# Patient Record
Sex: Female | Born: 1935 | Race: White | Hispanic: No | Marital: Married | Smoking: Never smoker
Health system: Southern US, Community
[De-identification: ages and names within clinical notes are randomized; demographics above are authoritative.]

## PROBLEM LIST (undated history)

## (undated) DIAGNOSIS — I10 Essential (primary) hypertension: Secondary | ICD-10-CM

## (undated) DIAGNOSIS — M1990 Unspecified osteoarthritis, unspecified site: Secondary | ICD-10-CM

---

## 2012-04-28 ENCOUNTER — Encounter (HOSPITAL_COMMUNITY): Payer: Self-pay | Admitting: Nurse Practitioner

## 2012-04-28 ENCOUNTER — Inpatient Hospital Stay (HOSPITAL_COMMUNITY)
Admission: AD | Admit: 2012-04-28 | Discharge: 2012-05-01 | DRG: 287 | Disposition: A | Discharge status: Home / Self Care | Payer: Medicare Other | Source: Other Acute Inpatient Hospital | Attending: Cardiovascular Disease | Admitting: Cardiovascular Disease

## 2012-04-28 DIAGNOSIS — Z79899 Other long term (current) drug therapy: Secondary | ICD-10-CM

## 2012-04-28 DIAGNOSIS — I447 Left bundle-branch block, unspecified: Secondary | ICD-10-CM

## 2012-04-28 DIAGNOSIS — I359 Nonrheumatic aortic valve disorder, unspecified: Secondary | ICD-10-CM

## 2012-04-28 DIAGNOSIS — M25519 Pain in unspecified shoulder: Secondary | ICD-10-CM | POA: Diagnosis present

## 2012-04-28 DIAGNOSIS — I2510 Atherosclerotic heart disease of native coronary artery without angina pectoris: Secondary | ICD-10-CM | POA: Diagnosis present

## 2012-04-28 DIAGNOSIS — I350 Nonrheumatic aortic (valve) stenosis: Secondary | ICD-10-CM

## 2012-04-28 DIAGNOSIS — Z7982 Long term (current) use of aspirin: Secondary | ICD-10-CM

## 2012-04-28 DIAGNOSIS — Z23 Encounter for immunization: Secondary | ICD-10-CM

## 2012-04-28 DIAGNOSIS — R748 Abnormal levels of other serum enzymes: Secondary | ICD-10-CM | POA: Diagnosis present

## 2012-04-28 DIAGNOSIS — R7989 Other specified abnormal findings of blood chemistry: Secondary | ICD-10-CM

## 2012-04-28 DIAGNOSIS — Y840 Cardiac catheterization as the cause of abnormal reaction of the patient, or of later complication, without mention of misadventure at the time of the procedure: Secondary | ICD-10-CM | POA: Diagnosis not present

## 2012-04-28 DIAGNOSIS — Y9210 Unspecified residential institution as the place of occurrence of the external cause: Secondary | ICD-10-CM | POA: Diagnosis not present

## 2012-04-28 DIAGNOSIS — M1990 Unspecified osteoarthritis, unspecified site: Secondary | ICD-10-CM | POA: Diagnosis present

## 2012-04-28 DIAGNOSIS — IMO0002 Reserved for concepts with insufficient information to code with codable children: Secondary | ICD-10-CM | POA: Diagnosis not present

## 2012-04-28 DIAGNOSIS — M25512 Pain in left shoulder: Secondary | ICD-10-CM

## 2012-04-28 DIAGNOSIS — I10 Essential (primary) hypertension: Secondary | ICD-10-CM | POA: Diagnosis present

## 2012-04-28 HISTORY — DX: Unspecified osteoarthritis, unspecified site: M19.90

## 2012-04-28 HISTORY — DX: Essential (primary) hypertension: I10

## 2012-04-28 LAB — HEPARIN LEVEL (UNFRACTIONATED): Heparin Unfractionated: 0.45 IU/mL (ref 0.30–0.70)

## 2012-04-28 LAB — MRSA PCR SCREENING: MRSA by PCR: NEGATIVE

## 2012-04-28 LAB — TROPONIN I: Troponin I: <0.3 ng/mL (ref <0.30)

## 2012-04-28 MED ORDER — METOPROLOL TARTRATE 25 MG PO TABS
25.0000 mg | ORAL_TABLET | Freq: Two times a day (BID) | ORAL | Status: DC
  Administered 2012-04-28 – 2012-05-01 (×6): 25 mg via ORAL
  Filled 2012-04-28 (×7): qty 1

## 2012-04-28 MED ORDER — SODIUM CHLORIDE 0.9 % IJ SOLN
3.0000 mL | Freq: Two times a day (BID) | INTRAMUSCULAR | Status: DC
  Administered 2012-04-28 – 2012-05-01 (×6): 3 mL via INTRAVENOUS

## 2012-04-28 MED ORDER — HEPARIN (PORCINE) IN NACL 100-0.45 UNIT/ML-% IJ SOLN
880.0000 [IU]/h | INTRAMUSCULAR | Status: DC
  Filled 2012-04-28: qty 250

## 2012-04-28 MED ORDER — SODIUM CHLORIDE 0.9 % IV SOLN
1.0000 mL/kg/h | INTRAVENOUS | Status: DC
  Administered 2012-04-29: 1 mL/kg/h via INTRAVENOUS

## 2012-04-28 MED ORDER — ATORVASTATIN CALCIUM 80 MG PO TABS
80.0000 mg | ORAL_TABLET | Freq: Every day | ORAL | Status: DC
  Administered 2012-04-28 – 2012-05-01 (×4): 80 mg via ORAL
  Filled 2012-04-28 (×4): qty 1

## 2012-04-28 MED ORDER — SODIUM CHLORIDE 0.9 % IV SOLN
250.0000 mL | INTRAVENOUS | Status: DC | PRN
  Administered 2012-04-28: 250 mL via INTRAVENOUS

## 2012-04-28 MED ORDER — ACETAMINOPHEN 325 MG PO TABS
650.0000 mg | ORAL_TABLET | ORAL | Status: DC | PRN
  Administered 2012-04-28 – 2012-05-01 (×4): 650 mg via ORAL
  Filled 2012-04-28 (×4): qty 2

## 2012-04-28 MED ORDER — ASPIRIN EC 81 MG PO TBEC
81.0000 mg | DELAYED_RELEASE_TABLET | Freq: Every day | ORAL | Status: DC
  Administered 2012-04-29 – 2012-05-01 (×2): 81 mg via ORAL
  Filled 2012-04-28 (×3): qty 1

## 2012-04-28 MED ORDER — SODIUM CHLORIDE 0.9 % IJ SOLN: 3.0000 mL | INTRAMUSCULAR | Status: DC | PRN

## 2012-04-28 MED ORDER — NITROGLYCERIN 0.4 MG SL SUBL: 0.4000 mg | SUBLINGUAL_TABLET | SUBLINGUAL | Status: DC | PRN

## 2012-04-28 MED ORDER — ONDANSETRON HCL 4 MG/2ML IJ SOLN: 4.0000 mg | Freq: Four times a day (QID) | INTRAMUSCULAR | Status: DC | PRN

## 2012-04-28 NOTE — Progress Notes (Signed)
  Echocardiogram 2D Echocardiogram has been performed.  Georgian Co 04/28/2012, 5:55 PM

## 2012-04-28 NOTE — Progress Notes (Signed)
ANTICOAGULATION CONSULT NOTE - Initial Consult  Pharmacy Consult for heparin Indication: chest pain/ACS  No Known Allergies  Patient Measurements: Height: 5\' 1"  (154.9 cm) Weight: 122 lb 2.2 oz (55.4 kg) IBW/kg (Calculated) : 47.8  Vital Signs: Temp: 97.5 F (36.4 C) (03/16 1500) Temp src: Oral (03/16 1500) BP: 140/43 mmHg (03/16 1500)  Labs: No results found for this basename: HGB, HCT, PLT, APTT, LABPROT, INR, HEPARINUNFRC, CREATININE, CKTOTAL, CKMB, TROPONINI,  in the last 72 hours  CrCl is unknown because no creatinine reading has been taken.   Medical History: Past Medical History  Diagnosis Date  . Hypertension     a. on lisinopril x 4 yrs.  . Osteoarthritis   . Heart murmur     Medications:  No prescriptions prior to admission    Assessment: 79 yo lady transferred from [facility] with c/o Left arm pain.  Heparin drip initiated there with 2800 unit bolus and drip at 880 units/hr.   Goal of Therapy:  Heparin level 0.3-0.7 units/ml Monitor platelets by anticoagulation protocol: Yes   Plan:  Continue heparin drip at 880 units/hr Check HL 8 hours after start. Daily HL and CBC.  Talbert Cage Poteet 04/28/2012,4:48 PM

## 2012-04-28 NOTE — H&P (Addendum)
Patient ID: Rita Walters MRN: [id number], DOB/AGE: [birth year]   Admit date: 04/28/2012   Primary Physician: A. Janene Madeira Primary Cardiologist: new - will f/u in [facility]  Pt. Profile:  79 year old female without prior cardiac history who presents on transfer from [facility] following complaints of left arm pain with mild troponin elevation.  Problem List  Past Medical History  Diagnosis Date  . Hypertension     a. on lisinopril x 4 yrs.  . Osteoarthritis   . Heart murmur     No past surgical history on file.   Allergies  Allergies not on file  HPI  79 year old female without prior cardiac history. She has never been hospitalized in her lifetime. She is quite functional at home. Approximately 2 weeks ago, she fell and struck her left upper body. She did not have any pain or obvious trauma and didn't think too much of this. On Friday, March 14, she had just arisen from the couch when she had sudden onset of left upper arm and shoulder discomfort without associated symptoms. This was somewhat worsened by abduction and abduction of the left arm. Discomfort occurred intermittently throughout the day on Friday without any particular aggravating factors or alleviating factors. Symptoms recurred Saturday morning prompting her to present to [facility] for evaluation. There, she was found to have mild elevation of troponin up to 0.09, and her ECG showed a left bundle branch block of unknown duration. She was also found to have a systolic murmur on exam. A bedside echocardiogram showed normal LV function and aortic stenosis. She has not had any further left arm or shoulder discomfort. She was transferred to [facility] for further evaluation and diagnostic catheterization. She is currently pain-free.  Home Medications  Lisinopril 20 mg daily Mobic 7.5 mg twice a day Tramadol 50 mg every 4-6 hours when necessary  Family History  Family History  Problem Relation Age of Onset    . Other      non-contributory for premature cad   Social History  History   Social History  . Marital Status: Married    Spouse Name: N/A    Number of Children: N/A  . Years of Education: N/A   Occupational History  . Not on file.   Social History Main Topics  . Smoking status: Never Smoker   . Smokeless tobacco: Not on file  . Alcohol Use: No  . Drug Use: No  . Sexually Active: Not on file   Other Topics Concern  . Not on file   Social History Narrative   Pt lives in [place] with her husband and her son.  She is retired.  She is active around the house but does not routinely exercise per se.    Review of Systems General:  No chills, fever, night sweats or weight changes.  Cardiovascular: Left upper arm and shoulder pain as outlined above. No chest pain, dyspnea on exertion, edema, orthopnea, palpitations, paroxysmal nocturnal dyspnea. Dermatological: No rash, lesions/masses Respiratory: No cough, dyspnea Urologic: No hematuria, dysuria Abdominal:   No nausea, vomiting, diarrhea, bright red blood per rectum, melena, or hematemesis Neurologic:  No visual changes, wkns, changes in mental status. All other systems reviewed and are otherwise negative except as noted above.  Physical Exam  There were no vitals taken for this visit.  General: Pleasant, NAD Psych: Normal affect. Neuro: Alert and oriented X 3. Moves all extremities spontaneously. HEENT: Normal  Neck: Supple without  JVD.  There is radiation of  the AS murmur into the  carotid Lungs:  Resp regular and unlabored, CTA. Heart: RRR no s3, s4. 2/6 systolic ejection murmur heard loudest at the right upper sternal border. Radiation into the right carotid Abdomen: Soft, non-tender, non-distended, BS + x 4.  Extremities: No clubbing, cyanosis or edema. DP/PT/Radials 2+ and equal bilaterally.  Labs  INR 0.9  CK 196 -> 144 -> 175  CK-MB 6.9 -> 5.5, 5.5  Troponin I 0.04 -> 0.09 -> 0.06  Sodium 137,  potassium 5.0, chloride 100, CO2 28, BUN 27, creatinine 0.94, calcium 9.1, total protein 7.6, albumin 4.2, alkaline phosphatase 68, AST 22, ALT 20, total bilirubin 0.4  Hemoglobin 11.5, hematocrit 35.5, WBC 8.6, platelets 256  Radiology/Studies  Chest x-ray on March 15 showed no evidence for acute abnormality.  X-ray of the left shoulder showed mild irregularity of the superior cortical surface of the distal clavicle raising question of fracture in that region.  ECG  Rsr, 86, lbbb of unknown duration.  ASSESSMENT AND PLAN  1. Left upper arm and shoulder pain with mild troponin elevation: In setting of left bundle branch block of unknown duration symptoms are concerning for non-ST elevation MI. We will check a troponin here as well as a 2-D echocardiogram as the one performed at [facility] was a quick look at bedside echo only. Continue aspirin, statin, beta blocker, and heparin. We will plan cardiac catheterization but would like to see echo results first.  2. Aortic stenosis: Question severity. Obtain 2-D echo.  3. Hypertension: Follow. Next  4. Status: Currently unknown. Continue statin therapy. LFTs were normal at [facility].  Signed, Nicolasa Ducking, NP 04/28/2012, 3:45 PM .  Attending Note:   The patient was seen and examined.  Agree with assessment and plan as noted above.  Changes made to the above note as needed.  Rita Walters is a relatively healthy female with hx of AS.  She developed left shoulder pain several days ago ( fell 2 weeks ago)  Evaluation at [facility] revealed a LBBB ( unknown duration).  She also had a very minimally elevated troponon.  She was transferred to [facility] for further eval.  Will repeat the echo.  The last AV mean gradient was 36.  We will schedule her for a right and left cath.  The bedside echo at [facility] shows normal LV function.   Vesta Mixer, Montez Hageman., MD, [facility] 04/28/2012, 4:08 PM

## 2012-04-28 NOTE — Progress Notes (Signed)
ANTICOAGULATION CONSULT NOTE - Follow Up Consult  Pharmacy Consult for heparin Indication: chest pain/ACS  No Known Allergies  Patient Measurements: Height: 5\' 1"  (154.9 cm) Weight: 122 lb 2.2 oz (55.4 kg) IBW/kg (Calculated) : 47.8  Vital Signs: Temp: 98.8 F (37.1 C) (03/16 2120) Temp src: Oral (03/16 2120) BP: 109/82 mmHg (03/16 1800) Pulse Rate: 66 (03/16 2120)  Labs:  Recent Labs  04/28/12 1744 04/28/12 2056  HEPARINUNFRC  --  0.45  TROPONINI <0.30  --     CrCl is unknown because no creatinine reading has been taken.   Medical History: Past Medical History  Diagnosis Date  . Hypertension     a. on lisinopril x 4 yrs.  . Osteoarthritis   . Heart murmur     Medications:  Prescriptions prior to admission  Medication Sig Dispense Refill  . aspirin EC 81 MG tablet Take 81 mg by mouth every other day.      . lisinopril (PRINIVIL,ZESTRIL) 20 MG tablet Take 20 mg by mouth daily.      . Multiple Vitamin (MULTIVITAMIN WITH MINERALS) TABS Take 1 tablet by mouth every morning.       . traMADol (ULTRAM) 50 MG tablet Take 50 mg by mouth every 8 (eight) hours as needed for pain. For pain        Assessment: 79 yo lady transferred from [facility] with c/o Left arm pain.  Heparin drip initiated there with 2800 unit bolus and drip at 880 units/hr.  Initial heparin level therapeutic. Goal of Therapy:  Heparin level 0.3-0.7 units/ml Monitor platelets by anticoagulation protocol: Yes   Plan:  Continue heparin drip at 880 units/hr Recheck HL in 8 hours to confirm.  Talbert Cage Poteet 04/28/2012,9:54 PM

## 2012-04-29 ENCOUNTER — Encounter (HOSPITAL_COMMUNITY): Payer: Self-pay | Admitting: Cardiology

## 2012-04-29 ENCOUNTER — Encounter (HOSPITAL_COMMUNITY)
Admission: AD | Disposition: A | Discharge status: Home / Self Care | Payer: Self-pay | Source: Other Acute Inpatient Hospital | Attending: Cardiovascular Disease

## 2012-04-29 DIAGNOSIS — I2510 Atherosclerotic heart disease of native coronary artery without angina pectoris: Secondary | ICD-10-CM

## 2012-04-29 DIAGNOSIS — I359 Nonrheumatic aortic valve disorder, unspecified: Secondary | ICD-10-CM

## 2012-04-29 HISTORY — PX: LEFT AND RIGHT HEART CATHETERIZATION WITH CORONARY ANGIOGRAM: SHX5449

## 2012-04-29 LAB — POCT I-STAT 3, VENOUS BLOOD GAS (G3P V)
Acid-Base Excess: 2 mmol/L (ref 0.0–2.0)
Bicarbonate: 27.1 mEq/L — ABNORMAL HIGH (ref 20.0–24.0)
Bicarbonate: 27.2 mEq/L — ABNORMAL HIGH (ref 20.0–24.0)
Bicarbonate: 27.6 mEq/L — ABNORMAL HIGH (ref 20.0–24.0)
O2 Saturation: 65 %
TCO2: 29 mmol/L (ref 0–100)
pCO2, Ven: 47 mmHg (ref 45.0–50.0)
pH, Ven: 7.37 — ABNORMAL HIGH (ref 7.250–7.300)
pH, Ven: 7.379 — ABNORMAL HIGH (ref 7.250–7.300)
pO2, Ven: 35 mmHg (ref 30.0–45.0)
pO2, Ven: 36 mmHg (ref 30.0–45.0)

## 2012-04-29 LAB — BASIC METABOLIC PANEL
BUN: 23 mg/dL (ref 6–23)
Creatinine, Ser: 0.99 mg/dL (ref 0.50–1.10)
GFR calc Af Amer: 60 mL/min — ABNORMAL LOW (ref >90)
GFR calc non Af Amer: 52 mL/min — ABNORMAL LOW (ref >90)
Potassium: 4.6 mEq/L (ref 3.5–5.1)

## 2012-04-29 LAB — CBC
Hemoglobin: 10.5 g/dL — ABNORMAL LOW (ref 12.0–15.0)
MCHC: 33.7 g/dL (ref 30.0–36.0)
RDW: 13.6 % (ref 11.5–15.5)
WBC: 5.9 10*3/uL (ref 4.0–10.5)

## 2012-04-29 LAB — LIPID PANEL: Cholesterol: 144 mg/dL (ref 0–200)

## 2012-04-29 LAB — POCT I-STAT 3, ART BLOOD GAS (G3+)
pCO2 arterial: 40.6 mmHg (ref 35.0–45.0)
pH, Arterial: 7.415 (ref 7.350–7.450)
pO2, Arterial: 81 mmHg (ref 80.0–100.0)

## 2012-04-29 SURGERY — LEFT AND RIGHT HEART CATHETERIZATION WITH CORONARY ANGIOGRAM
Anesthesia: LOCAL

## 2012-04-29 MED ORDER — LIDOCAINE HCL (PF) 1 % IJ SOLN
INTRAMUSCULAR | Status: AC
  Filled 2012-04-29: qty 30

## 2012-04-29 MED ORDER — ADULT MULTIVITAMIN W/MINERALS CH
1.0000 | ORAL_TABLET | Freq: Every morning | ORAL | Status: DC
  Administered 2012-04-30 – 2012-05-01 (×2): 1 via ORAL
  Filled 2012-04-29 (×3): qty 1

## 2012-04-29 MED ORDER — HEPARIN (PORCINE) IN NACL 2-0.9 UNIT/ML-% IJ SOLN
INTRAMUSCULAR | Status: AC
  Filled 2012-04-29: qty 1000

## 2012-04-29 MED ORDER — ASPIRIN EC 81 MG PO TBEC
81.0000 mg | DELAYED_RELEASE_TABLET | ORAL | Status: DC
  Filled 2012-04-29: qty 1

## 2012-04-29 MED ORDER — LISINOPRIL 20 MG PO TABS
20.0000 mg | ORAL_TABLET | Freq: Every day | ORAL | Status: DC
  Administered 2012-04-29 – 2012-04-30 (×2): 20 mg via ORAL
  Filled 2012-04-29 (×2): qty 1

## 2012-04-29 MED ORDER — SODIUM CHLORIDE 0.9 % IV SOLN: INTRAVENOUS | Status: AC

## 2012-04-29 MED FILL — Heparin Sodium (Porcine) 100 Unt/ML in Sodium Chloride 0.45%: INTRAMUSCULAR | Qty: 250 | Status: AC

## 2012-04-29 NOTE — Care Management Note (Signed)
    Page 1 of 1   04/29/2012     1:42:16 PM   CARE MANAGEMENT NOTE 04/29/2012  Patient:  Rita Walters,Rita Walters   Account Number:  [id number]  Date Initiated:  04/29/2012  Documentation initiated by:  Junius Creamer  Subjective/Objective Assessment:   adm w pos troponin     Action/Plan:   lives w husband, pcp dr a shah   Anticipated DC Date:     Anticipated DC Plan:        DC Planning Services  CM consult      Choice offered to / List presented to:             Status of service:   Medicare Important Message given?   (If response is "NO", the following Medicare IM given date fields will be blank) Date Medicare IM given:   Date Additional Medicare IM given:    Discharge Disposition:    Per UR Regulation:  Reviewed for med. necessity/level of care/duration of stay  If discussed at Long Length of Stay Meetings, dates discussed:    Comments:  3/17 1341 debbie Uma Jerde rn,bsn

## 2012-04-29 NOTE — CV Procedure (Signed)
Findings  Severe AS MIld pul HTN 75% LAD stenosis.   Full report to follow

## 2012-04-29 NOTE — Progress Notes (Signed)
ANTICOAGULATION CONSULT NOTE - Follow Up Consult  Pharmacy Consult for heparin Indication: chest pain/ACS  No Known Allergies  Patient Measurements: Height: 5\' 1"  (154.9 cm) Weight: 121 lb (54.885 kg) IBW/kg (Calculated) : 47.8  Vital Signs: Temp: 98.1 F (36.7 C) (03/17 0730) Temp src: Oral (03/17 0730) BP: 159/55 mmHg (03/17 0755) Pulse Rate: 59 (03/17 0400)  Labs:  Recent Labs  04/28/12 1744 04/28/12 2056 04/29/12 0520 04/29/12 0812  HGB  --   --  10.5*  --   HCT  --   --  31.2*  --   PLT  --   --  222  --   HEPARINUNFRC  --  0.45  --  0.64  CREATININE  --   --  0.99  --   TROPONINI <0.30  --   --   --     Estimated Creatinine Clearance: 33.1 ml/min (by C-G formula based on Cr of 0.99).   Medical History: Past Medical History  Diagnosis Date  . Hypertension     a. on lisinopril x 4 yrs.  . Osteoarthritis   . Heart murmur     Assessment: 79 yo female transferred from [facility] with c/o Left arm pain.  On heparin at goal (HL=0.64).  Goal of Therapy:  Heparin level 0.3-0.7 units/ml Monitor platelets by anticoagulation protocol: Yes   Plan:  -Continue heparin drip at 880 units/hr -Will follow plans for cath -Daily HL and CBC  Harland German, Pharm D 04/29/2012 9:20 AM

## 2012-04-29 NOTE — CV Procedure (Signed)
   Cardiac Catheterization Procedure Note  Name: Rita Walters MRN: [id number] DOB: 02/06/1928  Procedure: Right Heart Cath, Placement of catheters for angio without left heart cath, Selective Coronary Angiography, aortic root aortography.   Indication:   Left shoulder pain after fall pos troponins   Procedural Details: The right groin was prepped, draped, and anesthetized with 1% lidocaine. Using the modified Seldinger technique a 5 French sheath was placed in the right femoral artery and a 7 French sheath was placed in the right femoral vein. A Swan-Ganz catheter was used for the right heart catheterization. Standard protocol was followed for recording of right heart pressures and sampling of oxygen saturations. Fick cardiac output was calculated. Standard Judkins catheters were used for selective coronary angiography and left ventriculography. There were no immediate procedural complications. The patient was transferred to the post catheterization recovery area for further monitoring.  Procedural Findings: Hemodynamics RA 4 RV 37/4 PA 36/11 PCWP 12 LV not done AO 150/57 (93)  Oxygen saturations: SVC 67% PA 64% AO 96%  Cardiac Output (Fick) 4.91  Cardiac Index (Fick) 3.21 Cardiac Output (Thermo)  3.25 Cardiac Index  (Thermo)  2.12   Coronary angiography: Coronary dominance: right  Left mainstem: Mild calcification and normal   Left anterior descending (LAD): Mild irregularity and calcification proximally with about 40% narrowing.  40% narrowing at takeoff prior to second diagonal.  75% smooth narrowing in LAD after second diagonal.    Left circumflex (LCx): Provides a small ramus that is free of significant disease.  The OM is large and has 30% smooth narrowing.  Smaller AV circ without critical disease  Right coronary artery (RCA): Large caliber RCA providing a large PDA and moderate PLA.   Aortic root:  Heavily calcified aortic valve with fairly limited mobility.  Also  moderate AI noted.  Aortic root size is normal.    Left ventriculography: Not done   Final Conclusions:   1.  Severe AS by echo   0.6 cm2 2.  Moderately severe mid LAD stenosis not ideal for PCI.   Recommendations:  1.  I will review with Delane Ginger MD and decide on a plan.  The valve is fairly severe and she likely needs replacement soon.  ? Symptoms unclear.     Shawnie Pons 04/29/2012, 2:05 PM

## 2012-04-29 NOTE — Progress Notes (Signed)
Pt got back from cath lab with groin level 0; requested to use bedpan; log rolled onto bedpan; pt toileted; pt began to bleed at cath sight; pressure held; bug clot ??? Found in bedpan, unsure of origin; MD made aware and came to bedside; no new orders received; pt VSS; bed rest started over at 1530; will continue to monitor closely and update as needed

## 2012-04-29 NOTE — Interval H&P Note (Signed)
History and Physical Interval Note:  04/29/2012 12:45 PM  Rita Walters  has presented today for surgery, with the diagnosis of AS  The various methods of treatment have been discussed with the patient and family. After consideration of risks, benefits and other options for treatment, the patient has consented to  Procedure(s): LEFT AND RIGHT HEART CATHETERIZATION WITH CORONARY ANGIOGRAM (N/A) as a surgical intervention .  The patient's history has been reviewed, patient examined, no change in status, stable for surgery.  I have reviewed the patient's chart and labs.  Questions were answered to the patient's satisfaction.     Shawnie Pons

## 2012-04-30 ENCOUNTER — Inpatient Hospital Stay (HOSPITAL_COMMUNITY): Payer: Medicare Other

## 2012-04-30 DIAGNOSIS — I2510 Atherosclerotic heart disease of native coronary artery without angina pectoris: Secondary | ICD-10-CM | POA: Diagnosis present

## 2012-04-30 DIAGNOSIS — M25519 Pain in unspecified shoulder: Secondary | ICD-10-CM | POA: Diagnosis present

## 2012-04-30 DIAGNOSIS — I350 Nonrheumatic aortic (valve) stenosis: Secondary | ICD-10-CM | POA: Diagnosis present

## 2012-04-30 LAB — CBC
MCH: 30.9 pg (ref 26.0–34.0)
MCHC: 34.2 g/dL (ref 30.0–36.0)
Platelets: 231 10*3/uL (ref 150–400)
RBC: 3.11 MIL/uL — ABNORMAL LOW (ref 3.87–5.11)

## 2012-04-30 MED ORDER — LISINOPRIL 10 MG PO TABS
10.0000 mg | ORAL_TABLET | Freq: Every day | ORAL | Status: DC
  Administered 2012-05-01: 10 mg via ORAL
  Filled 2012-04-30: qty 1

## 2012-04-30 MED ORDER — TRAMADOL HCL 50 MG PO TABS
50.0000 mg | ORAL_TABLET | Freq: Three times a day (TID) | ORAL | Status: DC | PRN
  Administered 2012-04-30 – 2012-05-01 (×3): 50 mg via ORAL
  Filled 2012-04-30 (×3): qty 1

## 2012-04-30 MED ORDER — TRAMADOL HCL 50 MG PO TABS
25.0000 mg | ORAL_TABLET | Freq: Three times a day (TID) | ORAL | Status: DC | PRN
  Filled 2012-04-30: qty 1

## 2012-04-30 NOTE — Progress Notes (Signed)
Patient Name: Rita Walters Date of Encounter: 04/30/2012     Active Problems:   * No active hospital problems. *    SUBJECTIVE  Patient seen last night and again this am.  Groin reviewed yesterday.  Films reviewed today with both Dr. Elease Hashimoto and Excell Seltzer in detail.  She has AS which is moderately severe.  Has minor groin discomfort today after problems on bed pain yesterday.    CURRENT MEDS . aspirin EC  81 mg Oral Daily  . aspirin EC  81 mg Oral QODAY  . atorvastatin  80 mg Oral q1800  . lisinopril  20 mg Oral Daily  . metoprolol tartrate  25 mg Oral BID  . multivitamin with minerals  1 tablet Oral q morning - 10a  . sodium chloride  3 mL Intravenous Q12H    OBJECTIVE  Filed Vitals:   04/30/12 0011 04/30/12 0356 04/30/12 0400 04/30/12 0745  BP:  107/37  127/37  Pulse:    68  Temp: 98.2 F (36.8 C) 98.6 F (37 C)  97.7 F (36.5 C)  TempSrc: Oral Oral  Oral  Resp:  21  18  Height:      Weight:   116 lb 13.5 oz (53 kg)   SpO2:  97%  96%    Intake/Output Summary (Last 24 hours) at 04/30/12 0824 Last data filed at 04/29/12 2000  Gross per 24 hour  Intake 849.45 ml  Output   1200 ml  Net -350.55 ml   Filed Weights   04/29/12 0400 04/29/12 0500 04/30/12 0400  Weight: 121 lb (54.885 kg) 121 lb (54.885 kg) 116 lb 13.5 oz (53 kg)    PHYSICAL EXAM  General: Pleasant, NAD. Neuro: Alert and oriented X 3. Moves all extremities spontaneously. Psych: Normal affect. HEENT:  Normal  Neck: Supple without bruits or JVD. Shoulder:  Movement laterally and medially reproduces the pain.  Elevation does not affect pain.   Lungs:  Resp regular and unlabored, CTA. Heart: RRR.  Late SEM.   Abdomen: Soft, non-tender, non-distended, BS + x 4.  Extremities: Has bruising in R groin.  No bruit.  Stable cath site.    Accessory Clinical Findings  CBC  Recent Labs  04/29/12 0520 04/30/12 0450  WBC 5.9 6.5  HGB 10.5* 9.6*  HCT 31.2* 28.1*  MCV 91.8 90.4  PLT 222 231    Basic Metabolic Panel  Recent Labs  04/29/12 0520  NA 138  K 4.6  CL 105  CO2 27  GLUCOSE 97  BUN 23  CREATININE 0.99  CALCIUM 8.8   Liver Function Tests No results found for this basename: AST, ALT, ALKPHOS, BILITOT, PROT, ALBUMIN,  in the last 72 hours No results found for this basename: LIPASE, AMYLASE,  in the last 72 hours Cardiac Enzymes  Recent Labs  04/28/12 1744  TROPONINI <0.30   BNP No components found with this basename: POCBNP,  D-Dimer No results found for this basename: DDIMER,  in the last 72 hours Hemoglobin A1C No results found for this basename: HGBA1C,  in the last 72 hours Fasting Lipid Panel  Recent Labs  04/29/12 0520  CHOL 144  HDL 62  LDLCALC 69  TRIG 65  CHOLHDL 2.3   Thyroid Function Tests No results found for this basename: TSH, T4TOTAL, FREET3, T3FREE, THYROIDAB,  in the last 72 hours  TELE  No strips available.    ECG  LBBB  Radiology/Studies  No results found.  ASSESSMENT AND PLAN  1.  Shoulder pain, likely MS in etiology   ---  Plan xrays  ? MRI 2.  Moderately severe AS  -- consensus opinion is that AS will require treatment, but not urgent.  Suggest FU with Dr. Excell Seltzer.  Finally, consensus also is the LAD is unlikely source for symptoms. 3.  Groin bleed post cath ----  Appears stable.  Will watch and transfer out of unit later today.    Signed, Shawnie Pons MD, [facility], FSCAI

## 2012-04-30 NOTE — Progress Notes (Signed)
Received from 2900. Assessed per flow sheet. Denies chest pain or shortness of breath. C/o shoulder pain d/t recent injury prior to admission. Refuses pain med at this time. Right groin bruised  no hematoma palpable. Right groin marked per previous RN; will monitor. Call bell and family near. Mamie Levers

## 2012-05-01 LAB — CBC
HCT: 27.6 % — ABNORMAL LOW (ref 36.0–46.0)
MCH: 31 pg (ref 26.0–34.0)
MCHC: 34.4 g/dL (ref 30.0–36.0)
MCV: 90.2 fL (ref 78.0–100.0)
Platelets: 207 10*3/uL (ref 150–400)
RDW: 13.4 % (ref 11.5–15.5)
WBC: 7.1 10*3/uL (ref 4.0–10.5)

## 2012-05-01 MED ORDER — LISINOPRIL 20 MG PO TABS: 10.0000 mg | ORAL_TABLET | Freq: Every day | ORAL | Status: DC

## 2012-05-01 MED ORDER — METOPROLOL TARTRATE 25 MG PO TABS: 25.0000 mg | ORAL_TABLET | Freq: Two times a day (BID) | ORAL | Status: DC

## 2012-05-01 MED ORDER — ASPIRIN EC 81 MG PO TBEC: 81.0000 mg | DELAYED_RELEASE_TABLET | Freq: Every day | ORAL | Status: AC

## 2012-05-01 MED ORDER — ATORVASTATIN CALCIUM 80 MG PO TABS: 80.0000 mg | ORAL_TABLET | Freq: Every day | ORAL | Status: DC

## 2012-05-01 NOTE — H&P (Deleted)
Discharge Summary   Patient ID: Rita Walters MRN: [id number], DOB/AGE: 1927-10-31 79 y.o. Admit date: 04/28/2012 D/C date:     05/01/2012  Primary Cardiologist: Seen by Dr. Riley Kill this admission but lives in [place]. Will eventually need f/u with Dr. Excell Seltzer.  Primary Discharge Diagnoses:  1. Shoulder pain likely musculoskeletal in etiology 2. Moderately severe AS - will eventually require AVR  3. CAD - mildly elevated troponin at [facility] 0.09, but CAD not felt to be causing symptoms (see #1) - moderately-severe LAD stenosis not ideal for PCI by cath this admission - statin started, consider OP f/u labs 4. Anemia, blood loss related from groin bleed 5. HTN  Secondary Discharge Diagnoses:  1. Osteoarthritis  Hospital Course: [name] is an 79 y/o F with history of HTN, OA who has never been hospitalized in her lifetime. She has been quite functional at home. She presented to [facility] with complaints of L upper arm pain. Approximately 2 weeks ago, she fell and struck her left upper body. She did not have any pain or obvious trauma and didn't think too much of this. On Friday, March 14, she had just arisen from the couch when she had sudden onset of left upper arm and shoulder discomfort without associated symptoms. This was somewhat worsened by abduction and abduction of the left arm. The discomfort occurred intermittently throughout the day on Friday without any particular aggravating factors or alleviating factors. The symptoms recurred the next day sos he went to the ER for evaluation. There, she was found to have mild elevation of troponin up to 0.09 (0.04 -> 0.09 -> 0.06, CK 196 -> 144 -> 175, CK-MB 6.9 -> 5.5, 5.5) , and her ECG showed a left bundle branch block of unknown duration. She was also found to have a systolic murmur on exam. A bedside echocardiogram showed normal LV function and aortic stenosis. She was transferred to [facility] for further evaluation. She  underwent L&RH on 3/17 demonstrating AS and moderately severe mid LAD stenosis not ideal for PCI. Films were reviewed with colleagues and her AS was felt to be moderately-severe. The consensus was that AS will require treatment, but not urgent, and that the LAD is unlikely source for symptoms. She was started on BB/statin.The patient had a groin bleed post cath that improved and remained stable. Today she is feeling well. She does have some shoulder pain with movement but this is felt to be MSK in etiology. L shoulder film was negative. Dr. Riley Kill has seen and examined her and feels she is stable for discharge. She was seen by PT prior to discharge and no acute needs were identified. We will plan for close initial OP f/u in our [facility] office, then she should be referred to Dr. Excell Seltzer to follow her valve.  Discharge Vitals: Blood pressure 121/43, pulse 79, temperature 98.1 F (36.7 C), temperature source Oral, resp. rate 17, height 5\' 1"  (1.549 m), weight 122 lb 2.2 oz (55.4 kg), SpO2 96.00%.  Labs: Lab Results  Component Value Date   WBC 7.1 05/01/2012   HGB 9.5* 05/01/2012   HCT 27.6* 05/01/2012   MCV 90.2 05/01/2012   PLT 207 05/01/2012    Recent Labs Lab 04/29/12 0520  NA 138  K 4.6  CL 105  CO2 27  BUN 23  CREATININE 0.99  CALCIUM 8.8  GLUCOSE 97    Recent Labs  04/28/12 1744  TROPONINI <0.30   Lab Results  Component Value Date  CHOL 144 04/29/2012   HDL 62 04/29/2012   LDLCALC 69 04/29/2012   TRIG 65 04/29/2012   No results found for this basename: DDIMER    Diagnostic Studies/Procedures   1. Cardiac catheterization this admission, please see full report and above for summary.  2. 2D Echo 04/28/12 Study Conclusions - Left ventricle: The cavity size was mildly dilated. Wall thickness was normal. The estimated ejection fraction was 55%. - Aortic valve: There was moderate stenosis. Mild regurgitation. Valve area: 0.65cm^2(VTI). Valve area: 0.57cm^2 (Vmax). - Mitral valve:  Mild regurgitation. - Left atrium: The atrium was mildly dilated. - Atrial septum: No defect or patent foramen ovale was identified. - Pulmonary arteries: PA peak pressure: 40mm Hg (S).  3. Dg Shoulder Left  04/30/2012  *RADIOLOGY REPORT*  Clinical Data: Fall.  Left shoulder pain.  LEFT SHOULDER - 2+ VIEW  Comparison: None.  Findings: Internal and external rotation views appear normal. There is no fracture.  Scapula appears normal.  Visualized left chest unremarkable.  IMPRESSION: Negative.   Original Report Authenticated By: Andreas Newport, M.D.     Discharge Medications     Medication List    TAKE these medications       aspirin EC 81 MG tablet  Take 1 tablet (81 mg total) by mouth daily.     atorvastatin 80 MG tablet  Commonly known as:  LIPITOR  Take 1 tablet (80 mg total) by mouth daily.     lisinopril 20 MG tablet  Commonly known as:  PRINIVIL,ZESTRIL  Take 0.5 tablets (10 mg total) by mouth daily.     metoprolol tartrate 25 MG tablet  Commonly known as:  LOPRESSOR  Take 1 tablet (25 mg total) by mouth 2 (two) times daily.     multivitamin with minerals Tabs  Take 1 tablet by mouth every morning.     traMADol 50 MG tablet  Commonly known as:  ULTRAM  Take 50 mg by mouth every 8 (eight) hours as needed for pain. For pain        Disposition   The patient will be discharged in stable condition to home.     Discharge Orders   Future Appointments Provider Department Dept Phone   05/10/2012 1:55 PM Prescott Parma, PA-C [facility] (near [place]) [phone]   Future Orders Complete By Expires     Diet - low sodium heart healthy  As directed     Increase activity slowly  As directed     Comments:      No driving until cleared by your doctor. No lifting over 5 lbs for 1 week. No sexual activity for 1 week. Keep procedure site clean & dry. If you notice increased pain, swelling, bleeding or pus, call/return!  You may shower, but no soaking baths/hot  tubs/pools for 1 week.      Follow-up Information   Follow up with SERPE, EUGENE, PA-C. (05/10/12 at 1:55pm)    Contact information:   [facility] [place] [address] [place], [address] [place] [address] [phone]       Follow up with [facility], MD. (Follow up with primary doctor to further evaluate your shoulder pain)    Contact information:   [address]  [place] [address] [phone]         Duration of Discharge Encounter: Greater than 30 minutes including physician and PA time.  Signed, Dayna Dunn PA-C 05/01/2012, 1:51 PM

## 2012-05-01 NOTE — Evaluation (Signed)
Physical Therapy Evaluation Patient Details Name: Rita Walters MRN: [id number] DOB: [birth year] Today's Date: 05/01/2012 Time: 4098-1191 PT Time Calculation (min): 28 min  PT Assessment / Plan / Recommendation Clinical Impression  patient admitted with symptom of worsening intermittent L arm pain and chest pain.  On arrival, pt found to have Aortic Stenosis that will eventually need attension, but not urgent.   Although not back to baseline and mildly unsteady with gait, pt is safe enough to D/C home with family and will be capable of recuperating on her own.  No further PT, no follow up.    PT Assessment  Patent does not need any further PT services    Follow Up Recommendations  No PT follow up    Does the patient have the potential to tolerate intense rehabilitation      Barriers to Discharge        Equipment Recommendations  None recommended by PT    Recommendations for Other Services     Frequency      Precautions / Restrictions Precautions Precautions: Fall Restrictions Weight Bearing Restrictions: No   Pertinent Vitals/Pain       Mobility  Bed Mobility Bed Mobility: Supine to Sit;Sitting - Scoot to Edge of Bed Supine to Sit: 6: Modified independent (Device/Increase time) Sitting - Scoot to Edge of Bed: 6: Modified independent (Device/Increase time) Details for Bed Mobility Assistance: safe mobility Transfers Transfers: Sit to Stand;Stand to Sit Sit to Stand: 5: Supervision;With upper extremity assist;From bed Stand to Sit: 5: Supervision Details for Transfer Assistance: safe mobility Ambulation/Gait Ambulation/Gait Assistance: 4: Min guard Ambulation Distance (Feet): 280 Feet Assistive device: Rolling walker Ambulation/Gait Assistance Details: mildly unsteady with tendency to wander to the Left.  No overt LOB or deviations other than difficulty keeping RW straight. Gait Pattern: Step-through pattern Stairs: No    Exercises     PT Diagnosis:    PT  Problem List:   PT Treatment Interventions:     PT Goals    Visit Information  Last PT Received On: 05/01/12 Assistance Needed: +1    Subjective Data  Subjective: No, my shoulder didn't just start hurting Saturday when I came in.  It had been hurting on/off and it just got worse. They says it's not from a heart problem. Patient Stated Goal: Ready to go home   Prior Functioning  Home Living Lives With: Spouse;Son Available Help at Discharge: Family;Available 24 hours/day Type of Home: House Home Access: Stairs to enter [employer] of Steps: 2 Entrance Stairs-Rails: None Home Layout: One level Bathroom Shower/Tub: Tub/shower unit;Door [employer] Toilet: Standard Home Adaptive Equipment: Bedside commode/3-in-1;Straight cane;Walker - rolling;Walker - standard Prior Function Level of Independence: Independent with assistive device(s) Able to Take Stairs?: Yes Driving: No Communication Communication: No difficulties    Cognition  Cognition Overall Cognitive Status: Appears within functional limits for tasks assessed/performed Arousal/Alertness: Awake/alert Orientation Level: Appears intact for tasks assessed Behavior During Session: [facility] for tasks performed    Extremity/Trunk Assessment Right Lower Extremity Assessment RLE ROM/Strength/Tone: Within functional levels Left Lower Extremity Assessment LLE ROM/Strength/Tone: Within functional levels Trunk Assessment Trunk Assessment: Normal   Balance Balance Balance Assessed: No  End of Session PT - End of Session Activity Tolerance: Patient tolerated treatment well Patient left: Other (comment);with call bell/phone within reach;with restraints reapplied (sitting EOB) Nurse Communication: Mobility status  GP     Mickayla Trouten, Eliseo Gum 05/01/2012, 4:04 PM 05/01/2012  [place], PT [phone] (pager)

## 2012-05-01 NOTE — Progress Notes (Addendum)
Patient Name: Rita Walters Date of Encounter: 05/01/2012     Active Problems:   Shoulder pain   Aortic stenosis   CAD (coronary artery disease), native coronary artery    SUBJECTIVE  No unstable symptoms.  Shoulder pain is made worse with forward and backward motion, unassociated with diaphoresis or shortness of breath.    CURRENT MEDS . aspirin EC  81 mg Oral Daily  . atorvastatin  80 mg Oral q1800  . lisinopril  10 mg Oral Daily  . metoprolol tartrate  25 mg Oral BID  . multivitamin with minerals  1 tablet Oral q morning - 10a  . sodium chloride  3 mL Intravenous Q12H    OBJECTIVE  Filed Vitals:   04/30/12 1600 04/30/12 1723 04/30/12 2135 05/01/12 0641  BP: 99/36 107/37 105/45 133/46  Pulse: 53 70 71 73  Temp: 98.2 F (36.8 C) 98.6 F (37 C) 98.1 F (36.7 C) 98.1 F (36.7 C)  TempSrc: Oral Oral Oral Oral  Resp: 15 18 18 18   Height:      Weight:    122 lb 2.2 oz (55.4 kg)  SpO2: 98% 98% 98% 99%    Intake/Output Summary (Last 24 hours) at 05/01/12 0903 Last data filed at 04/30/12 2300  Gross per 24 hour  Intake    480 ml  Output    650 ml  Net   -170 ml   Filed Weights   04/29/12 0500 04/30/12 0400 05/01/12 0641  Weight: 121 lb (54.885 kg) 116 lb 13.5 oz (53 kg) 122 lb 2.2 oz (55.4 kg)    PHYSICAL EXAM  General: Pleasant, NAD. Neuro: Alert and oriented X 3. Moves all extremities spontaneously. Psych: Normal affect. HEENT:  Normal  Neck: Supple without bruits or JVD.  S Shoulder:  A and P movement makes it worse with activity Lungs:  Resp regular and unlabored, CTA. Heart: RRR.  3/6 mid to late peaking SEM.   Abdomen: Soft, non-tender, non-distended, BS + x 4.  Extremities: No clubbing, cyanosis or edema. DP/PT/Radials 2+ and equal bilaterally.  Accessory Clinical Findings  CBC  Recent Labs  04/30/12 0450 05/01/12 0500  WBC 6.5 7.1  HGB 9.6* 9.5*  HCT 28.1* 27.6*  MCV 90.4 90.2  PLT 231 207   Basic Metabolic Panel  Recent Labs  04/29/12 0520  NA 138  K 4.6  CL 105  CO2 27  GLUCOSE 97  BUN 23  CREATININE 0.99  CALCIUM 8.8   Liver Function Tests No results found for this basename: AST, ALT, ALKPHOS, BILITOT, PROT, ALBUMIN,  in the last 72 hours No results found for this basename: LIPASE, AMYLASE,  in the last 72 hours Cardiac Enzymes  Recent Labs  04/28/12 1744  TROPONINI <0.30   BNP No components found with this basename: POCBNP,  D-Dimer No results found for this basename: DDIMER,  in the last 72 hours Hemoglobin A1C No results found for this basename: HGBA1C,  in the last 72 hours Fasting Lipid Panel  Recent Labs  04/29/12 0520  CHOL 144  HDL 62  LDLCALC 69  TRIG 65  CHOLHDL 2.3   Thyroid Function Tests No results found for this basename: TSH, T4TOTAL, FREET3, T3FREE, THYROIDAB,  in the last 72 hours    ECG  NSR. LBBB  Radiology/Studies  Dg Shoulder Left  04/30/2012  *RADIOLOGY REPORT*  Clinical Data: Fall.  Left shoulder pain.  LEFT SHOULDER - 2+ VIEW  Comparison: None.  Findings: Internal and external rotation views  appear normal. There is no fracture.  Scapula appears normal.  Visualized left chest unremarkable.  IMPRESSION: Negative.   Original Report Authenticated By: Andreas Newport, M.D.     ASSESSMENT AND PLAN  1.  Shoulder pain likely MS in etiology 2.  Moderately severe AS, reviewed with colleagues.  3.  Coronary artery disease, moderate LAD 4.  Anemia, blood loss related 5.  Advanced age.      Plan   Ambulate this am. Early fu as OP with Dr. Diona Browner or Excell Seltzer She will likely require AVR within a year or two.    Discussed with son.    Signed, Shawnie Pons MD, [facility], FSCAI  More than thirty minutes of combined DC time.

## 2012-05-01 NOTE — Discharge Summary (Signed)
Discharge Summary   Patient ID: Rita Walters MRN: [id number], DOB/AGE: 77-Mar-1929 77 y.o. Admit date: 04/28/2012 D/C date:     05/01/2012  Primary Cardiologist: Seen by Dr. Riley Kill this admission but lives in [place]. Will eventually need f/u with Dr. Excell Seltzer.  Primary Discharge Diagnoses:  1. Shoulder pain likely musculoskeletal in etiology 2. Moderately severe AS - will eventually require AVR  3. CAD - mildly elevated troponin at [facility] 0.09, but CAD not felt to be causing symptoms (see #1) - moderately-severe LAD stenosis not ideal for PCI by cath this admission - statin started, consider OP f/u labs 4. Anemia, blood loss related from groin bleed 5. HTN  Secondary Discharge Diagnoses:  1. Osteoarthritis  Hospital Course: [name] is an 79 y/o F with history of HTN, OA who has never been hospitalized in her lifetime. She has been quite functional at home. She presented to [facility] with complaints of L upper arm pain. Approximately 2 weeks ago, she fell and struck her left upper body. She did not have any pain or obvious trauma and didn't think too much of this. On Friday, March 14, she had just arisen from the couch when she had sudden onset of left upper arm and shoulder discomfort without associated symptoms. This was somewhat worsened by abduction and abduction of the left arm. The discomfort occurred intermittently throughout the day on Friday without any particular aggravating factors or alleviating factors. The symptoms recurred the next day sos he went to the ER for evaluation. There, she was found to have mild elevation of troponin up to 0.09 (0.04 -> 0.09 -> 0.06, CK 196 -> 144 -> 175, CK-MB 6.9 -> 5.5, 5.5) , and her ECG showed a left bundle branch block of unknown duration. She was also found to have a systolic murmur on exam. A bedside echocardiogram showed normal LV function and aortic stenosis. She was transferred to [facility] for further evaluation. She  underwent L&RH on 3/17 demonstrating AS and moderately severe mid LAD stenosis not ideal for PCI. Films were reviewed with colleagues and her AS was felt to be moderately-severe. The consensus was that AS will require treatment, but not urgent, and that the LAD is unlikely source for symptoms. She was started on BB/statin.The patient had a groin bleed post cath that improved and remained stable. Today she is feeling well. She does have some shoulder pain with movement but this is felt to be MSK in etiology. L shoulder film was negative. Dr. Riley Kill has seen and examined her and feels she is stable for discharge. She was seen by PT prior to discharge and no acute needs were identified. We will plan for close initial OP f/u in our [facility] office, then she should be referred to Dr. Excell Seltzer to follow her valve.  Discharge Vitals: Blood pressure 121/43, pulse 79, temperature 98.1 F (36.7 C), temperature source Oral, resp. rate 17, height 5\' 1"  (1.549 m), weight 122 lb 2.2 oz (55.4 kg), SpO2 96.00%.  Labs: Lab Results  Component Value Date   WBC 7.1 05/01/2012   HGB 9.5* 05/01/2012   HCT 27.6* 05/01/2012   MCV 90.2 05/01/2012   PLT 207 05/01/2012    Recent Labs Lab 04/29/12 0520  NA 138  K 4.6  CL 105  CO2 27  BUN 23  CREATININE 0.99  CALCIUM 8.8  GLUCOSE 97    Recent Labs  04/28/12 1744  TROPONINI <0.30   Lab Results  Component Value Date  CHOL 144 04/29/2012   HDL 62 04/29/2012   LDLCALC 69 04/29/2012   TRIG 65 04/29/2012   No results found for this basename: DDIMER    Diagnostic Studies/Procedures   1. Cardiac catheterization this admission, please see full report and above for summary.  2. 2D Echo 04/28/12 Study Conclusions - Left ventricle: The cavity size was mildly dilated. Wall thickness was normal. The estimated ejection fraction was 55%. - Aortic valve: There was moderate stenosis. Mild regurgitation. Valve area: 0.65cm^2(VTI). Valve area: 0.57cm^2 (Vmax). - Mitral valve:  Mild regurgitation. - Left atrium: The atrium was mildly dilated. - Atrial septum: No defect or patent foramen ovale was identified. - Pulmonary arteries: PA peak pressure: 40mm Hg (S).  3. Dg Shoulder Left  04/30/2012  *RADIOLOGY REPORT*  Clinical Data: Fall.  Left shoulder pain.  LEFT SHOULDER - 2+ VIEW  Comparison: None.  Findings: Internal and external rotation views appear normal. There is no fracture.  Scapula appears normal.  Visualized left chest unremarkable.  IMPRESSION: Negative.   Original Report Authenticated By: Andreas Newport, M.D.     Discharge Medications     Medication List    TAKE these medications       aspirin EC 81 MG tablet  Take 1 tablet (81 mg total) by mouth daily.     atorvastatin 80 MG tablet  Commonly known as:  LIPITOR  Take 1 tablet (80 mg total) by mouth daily.     lisinopril 20 MG tablet  Commonly known as:  PRINIVIL,ZESTRIL  Take 0.5 tablets (10 mg total) by mouth daily.     metoprolol tartrate 25 MG tablet  Commonly known as:  LOPRESSOR  Take 1 tablet (25 mg total) by mouth 2 (two) times daily.     multivitamin with minerals Tabs  Take 1 tablet by mouth every morning.     traMADol 50 MG tablet  Commonly known as:  ULTRAM  Take 50 mg by mouth every 8 (eight) hours as needed for pain. For pain        Disposition   The patient will be discharged in stable condition to home.     Discharge Orders   Future Appointments Provider Department Dept Phone   05/10/2012 1:55 PM Prescott Parma, PA-C [place] [facility] ([facility] [place]) [phone]   Future Orders Complete By Expires     Diet - low sodium heart healthy  As directed     Increase activity slowly  As directed     Comments:      No driving until cleared by your doctor. No lifting over 5 lbs for 1 week. No sexual activity for 1 week. Keep procedure site clean & dry. If you notice increased pain, swelling, bleeding or pus, call/return!  You may shower, but no soaking baths/hot  tubs/pools for 1 week.      Follow-up Information   Follow up with SERPE, EUGENE, PA-C. (05/10/12 at 1:55pm)    Contact information:   [facility] [place] [address] [place], [address] [place] [address] [phone]       Follow up with [facility], MD. (Follow up with primary doctor to further evaluate your shoulder pain)    Contact information:   [address]  [place] [address] [phone]         Duration of Discharge Encounter: Greater than 30 minutes including physician and PA time.  Signed, Ronie Spies PA-C 05/01/2012, 1:51 PM  Patient was discharged with planned follow up.  I reviewed her  data extensively with Dr. Excell Seltzer.  He will be glad to see her in follow up.  It was felt that her symptoms were likely not tied to her AS, but that she would eventually need AVR.  However, she had been without symptoms prior to recent admission.  The patient and her family is fully informed.

## 2012-05-01 NOTE — Progress Notes (Signed)
Patient ambulated 150 ft in hall way using front wheel walker. Patient unsteady at time. States she has a couple of walkers at home. Returned to room w/out incidence. Call bell and husband near. Will monitor. Mamie Levers

## 2012-05-01 NOTE — Progress Notes (Signed)
D/c instructions along with med list  Provided. Prescriptions called to patient's pharmacy per Md. Iv d/c'd with catheter intact. Belongings with patient and family. Patient transported via wheelchair to main lobby for d/c home. Rita Walters

## 2012-05-06 ENCOUNTER — Telehealth: Payer: Self-pay | Admitting: *Deleted

## 2012-05-06 NOTE — Telephone Encounter (Signed)
SPOKE WITH PT  PER PT FEELS FINE SINCE DISCHARGE ALSO REVIEWED MED LIST WITH PT  AND PT HAS ALL MEDS AND F/U  IS SCHEDULED FOR  05-10-12  WITH GENE SERPE PAC .Zack Seal

## 2012-05-10 ENCOUNTER — Encounter: Payer: Self-pay | Admitting: Physician Assistant

## 2012-05-10 ENCOUNTER — Ambulatory Visit (INDEPENDENT_AMBULATORY_CARE_PROVIDER_SITE_OTHER): Payer: Medicare Other | Admitting: Physician Assistant

## 2012-05-10 VITALS — BP 133/67 | HR 101 | Ht 62.0 in | Wt 128.8 lb

## 2012-05-10 DIAGNOSIS — E785 Hyperlipidemia, unspecified: Secondary | ICD-10-CM

## 2012-05-10 DIAGNOSIS — I2510 Atherosclerotic heart disease of native coronary artery without angina pectoris: Secondary | ICD-10-CM

## 2012-05-10 DIAGNOSIS — I350 Nonrheumatic aortic (valve) stenosis: Secondary | ICD-10-CM

## 2012-05-10 DIAGNOSIS — I359 Nonrheumatic aortic valve disorder, unspecified: Secondary | ICD-10-CM

## 2012-05-10 DIAGNOSIS — R0989 Other specified symptoms and signs involving the circulatory and respiratory systems: Secondary | ICD-10-CM

## 2012-05-10 DIAGNOSIS — I10 Essential (primary) hypertension: Secondary | ICD-10-CM

## 2012-05-10 MED ORDER — ATORVASTATIN CALCIUM 40 MG PO TABS: 40.0000 mg | ORAL_TABLET | Freq: Every evening | ORAL | Status: DC

## 2012-05-10 MED ORDER — METOPROLOL TARTRATE 50 MG PO TABS: 50.0000 mg | ORAL_TABLET | Freq: Two times a day (BID) | ORAL | Status: DC

## 2012-05-10 NOTE — Assessment & Plan Note (Addendum)
Continued medical management of 1v CAD with moderately severe LAD disease, deemed not ideal for PCI.

## 2012-05-10 NOTE — Patient Instructions (Addendum)
   Decrease Lipitor to 40mg  every evening  Increase Lopressor to 50mg  twice a day   Continue all other current medications.  Carotid dopplers Office will contact with results Labs for fasting lipid and liver panel in 12 weeks (around August 10, 2012) - will send reminder in mail Follow up in  4 months

## 2012-05-10 NOTE — Progress Notes (Signed)
Primary Cardiologist: Simona Huh, MD (new)   HPI: Post hospital followup from [facility], status post initial presentation to [facility] ED with recurrent left shoulder discomfort and new LBBB, but no CP. She presented with no known CAD, but history of HTN and AS. She had had prior echocardiogram, 10/2011, per Dr. Margaretmary Eddy office, suggestive of severe/critical AS (AVA 0.6-0.7 cm), moderate MR, and moderate PHTN.    Patient was seen in the ED by the cardiology fellow, who was concerned that the patient's shoulder discomfort could possibly represent an anginal equivalent. Cardiac markers notable for peak troponin of 0.09, with initial abnormal MB of 6.9. She was started on IV heparin, Lopressor, and high-dose statin, with arrangements made for direct transfer to [facility].  Following transfer, she was referred for right/left heart catheterization, on March 17, with results as follows:   - Moderately severe 75% mid LAD stenosis, not ideal for PCI; LVG deferred   - Complete echocardiogram: EF 55%; moderate AS (AVA 0.65 cm by VTI); mild AR; mild MR  Upon further review of films, it was felt that the AS was moderately severe. Recommendation was for her to ultimately be referred to Dr. Tonny Bollman for further recommendations regarding treatment of her AS.  Clinically, she continues to report no CP, or prior history of such. Her left shoulder discomfort has resolved. She has only mild DOE, with no orthopnea, PND, or peripheral edema. She denies frank syncope or tachycardia palpitations.  She denies complications of R groin incision site.  No Known Allergies  Current Outpatient Prescriptions  Medication Sig Dispense Refill  . aspirin EC 81 MG tablet Take 1 tablet (81 mg total) by mouth daily.      Marland Kitchen atorvastatin (LIPITOR) 40 MG tablet Take 1 tablet (40 mg total) by mouth every evening.  30 tablet  6  . lisinopril (PRINIVIL,ZESTRIL) 20 MG tablet Take 0.5 tablets (10 mg total) by mouth daily.      . metoprolol  tartrate (LOPRESSOR) 50 MG tablet Take 1 tablet (50 mg total) by mouth 2 (two) times daily.  60 tablet  6  . Multiple Vitamin (MULTIVITAMIN WITH MINERALS) TABS Take 1 tablet by mouth every morning.       . traMADol (ULTRAM) 50 MG tablet Take 50 mg by mouth every 8 (eight) hours as needed for pain. For pain       No current facility-administered medications for this visit.    Past Medical History  Diagnosis Date  . Hypertension     a. on lisinopril x 4 yrs.  . Osteoarthritis     No past surgical history on file.  History   Social History  . Marital Status: Married    Spouse Name: N/A    Number of Children: N/A  . Years of Education: N/A   Occupational History  . Not on file.   Social History Main Topics  . Smoking status: Never Smoker   . Smokeless tobacco: Not on file  . Alcohol Use: No  . Drug Use: No  . Sexually Active: Not Currently   Other Topics Concern  . Not on file   Social History Narrative   Pt lives in [place] with her husband and her son.  She is retired.  She is active around the house but does not routinely exercise per se.    Family History  Problem Relation Age of Onset  . Other      non-contributory for premature cad    ROS: no nausea, vomiting; no fever,  chills; no melena, hematochezia; no claudication  PHYSICAL EXAM: BP 133/67  Pulse 101  Ht 5\' 2"  (1.575 m)  Wt 128 lb 12.8 oz (58.423 kg)  BMI 23.55 kg/m2 GENERAL:  79 year old female; NAD HEENT: NCAT, PERRLA, EOMI; sclera clear; no xanthelasma NECK: palpable bilateral carotid pulses, bilateral infraclavicular bruits; left supraclavicular bruit; no JVD; no TM LUNGS: CTA bilaterally CARDIAC: RRR (S1, S2);  harsh grade 3/6 crescendo decrescendo murmur at base; preserved S2; no rubs or gallops ABDOMEN: soft, non-tender; intact BS EXTREMETIES: intact distal pulses; no significant peripheral edema SKIN: warm/dry; no obvious rash/lesions MUSCULOSKELETAL: no joint deformity NEURO: no focal  deficit; NL affect   EKG:    ASSESSMENT & PLAN:  CAD (coronary artery disease), native coronary artery Continued medical management of 1v CAD with moderately severe LAD disease, deemed not ideal for PCI.  Aortic stenosis Continued close monitoring with early followup and surveillance echocardiograms, with plan to subsequently refer her to Dr. Tonny Bollman regarding treatment options for her AS.   HLD (hyperlipidemia) Will decrease high-dose statin, which was just initiated, to 40 mg daily. Recent lipid profile notable for LDL 65.  Hypertension Patient was recently advised to decrease lisinopril dose by 1/2, most likely in light of her significant AS. I advised her to continue with this recommendation. She was also just started on beta blocker therapy with Lopressor, which I will increase to 50 twice a day. This will help modulate both her resting sinus tachycardia, as well as her HTN.  Carotid bruit Will order carotid Dopplers to exclude true left ICA disease, versus radiated murmur of aortic stenosis.    Gene Kendarius Vigen, PAC

## 2012-05-10 NOTE — Assessment & Plan Note (Signed)
Will decrease high-dose statin, which was just initiated, to 40 mg daily. Recent lipid profile notable for LDL 65.

## 2012-05-10 NOTE — Assessment & Plan Note (Signed)
Will order carotid Dopplers to exclude true left ICA disease, versus radiated murmur of aortic stenosis.

## 2012-05-10 NOTE — Assessment & Plan Note (Signed)
Continued close monitoring with early followup and surveillance echocardiograms, with plan to subsequently refer her to Dr. Tonny Bollman regarding treatment options for her AS.

## 2012-05-10 NOTE — Assessment & Plan Note (Signed)
Patient was recently advised to decrease lisinopril dose by 1/2, most likely in light of her significant AS. I advised her to continue with this recommendation. She was also just started on beta blocker therapy with Lopressor, which I will increase to 50 twice a day. This will help modulate both her resting sinus tachycardia, as well as her HTN.

## 2012-05-23 ENCOUNTER — Encounter (INDEPENDENT_AMBULATORY_CARE_PROVIDER_SITE_OTHER): Payer: Medicare Other

## 2012-05-23 DIAGNOSIS — R0989 Other specified symptoms and signs involving the circulatory and respiratory systems: Secondary | ICD-10-CM

## 2012-05-23 DIAGNOSIS — I6529 Occlusion and stenosis of unspecified carotid artery: Secondary | ICD-10-CM

## 2012-05-28 ENCOUNTER — Encounter: Payer: Self-pay | Admitting: *Deleted

## 2012-08-12 ENCOUNTER — Ambulatory Visit (INDEPENDENT_AMBULATORY_CARE_PROVIDER_SITE_OTHER): Payer: Medicare Other | Admitting: Physician Assistant

## 2012-08-12 ENCOUNTER — Encounter: Payer: Self-pay | Admitting: Physician Assistant

## 2012-08-12 VITALS — BP 120/70 | HR 64 | Resp 16 | Ht 61.0 in | Wt 125.2 lb

## 2012-08-12 DIAGNOSIS — I359 Nonrheumatic aortic valve disorder, unspecified: Secondary | ICD-10-CM

## 2012-08-12 DIAGNOSIS — I350 Nonrheumatic aortic (valve) stenosis: Secondary | ICD-10-CM

## 2012-08-12 DIAGNOSIS — I10 Essential (primary) hypertension: Secondary | ICD-10-CM

## 2012-08-12 DIAGNOSIS — R0989 Other specified symptoms and signs involving the circulatory and respiratory systems: Secondary | ICD-10-CM

## 2012-08-12 DIAGNOSIS — I2510 Atherosclerotic heart disease of native coronary artery without angina pectoris: Secondary | ICD-10-CM

## 2012-08-12 DIAGNOSIS — E785 Hyperlipidemia, unspecified: Secondary | ICD-10-CM

## 2012-08-12 MED ORDER — NITROGLYCERIN 0.4 MG SL SUBL: 0.4000 mg | SUBLINGUAL_TABLET | SUBLINGUAL | Status: DC | PRN

## 2012-08-12 NOTE — Progress Notes (Signed)
Primary Cardiologist: Simona Huh, MD   HPI: Scheduled 3 month followup.  She returns today feeling "pretty good". She denies interim development of CP, DOE, tachycardia palpitations, or near syncope/syncope. Her salient complaint is that of chronic R knee pain, due to osteoarthritis.  No Known Allergies  Current Outpatient Prescriptions  Medication Sig Dispense Refill  . aspirin EC 81 MG tablet Take 1 tablet (81 mg total) by mouth daily.      Marland Kitchen atorvastatin (LIPITOR) 40 MG tablet Take 1 tablet (40 mg total) by mouth every evening.  30 tablet  6  . furosemide (LASIX) 20 MG tablet Take 20 mg by mouth daily.      Marland Kitchen lisinopril (PRINIVIL,ZESTRIL) 20 MG tablet Take 0.5 tablets (10 mg total) by mouth daily.      . metoprolol tartrate (LOPRESSOR) 50 MG tablet Take 1 tablet (50 mg total) by mouth 2 (two) times daily.  60 tablet  6  . Multiple Vitamin (MULTIVITAMIN WITH MINERALS) TABS Take 1 tablet by mouth every morning.       . potassium chloride (K-DUR) 10 MEQ tablet Take 10 mEq by mouth daily.      . traMADol (ULTRAM) 50 MG tablet Take 50 mg by mouth every 8 (eight) hours as needed for pain. For pain      . nitroGLYCERIN (NITROSTAT) 0.4 MG SL tablet Place 1 tablet (0.4 mg total) under the tongue every 5 (five) minutes as needed for chest pain.  25 tablet  3   No current facility-administered medications for this visit.    Past Medical History  Diagnosis Date  . Hypertension     a. on lisinopril x 4 yrs.  . Osteoarthritis     No past surgical history on file.  History   Social History  . Marital Status: Married    Spouse Name: N/A    Number of Children: N/A  . Years of Education: N/A   Occupational History  . Not on file.   Social History Main Topics  . Smoking status: Never Smoker   . Smokeless tobacco: Not on file  . Alcohol Use: No  . Drug Use: No  . Sexually Active: Not Currently   Other Topics Concern  . Not on file   Social History Narrative   Pt lives in  [place] with her husband and her son.  She is retired.  She is active around the house but does not routinely exercise per se.    Family History  Problem Relation Age of Onset  . Other      non-contributory for premature cad    ROS: no nausea, vomiting; no fever, chills; no melena, hematochezia; no claudication  PHYSICAL EXAM: BP 120/70  Pulse 64  Resp 16  Ht 5\' 1"  (1.549 m)  Wt 125 lb 4 oz (56.813 kg)  BMI 23.68 kg/m2 GENERAL: 79 year old female; NAD  HEENT: NCAT, PERRLA, EOMI; sclera clear; no xanthelasma  NECK: no JVD; no TM  LUNGS: CTA bilaterally  CARDIAC: RRR (S1, S2); harsh grade 3/6 crescendo decrescendo murmur at base; preserved S2; no rubs or gallops  ABDOMEN: soft, non-tender; intact BS  EXTREMETIES: no significant peripheral edema  SKIN: warm/dry; no obvious rash/lesions  MUSCULOSKELETAL: no joint deformity  NEURO: no focal deficit; NL affect    EKG:    ASSESSMENT & PLAN:  CAD (coronary artery disease), native coronary artery Remains quiescent on current medication regimen. Will provide prn NTG.  Aortic stenosis Assessed as moderate, by echocardiography at [facility], 04/2012. Recommend  continued close monitoring wityh surveillance echocardiograms, with plans to eventually refer her to Dr. Excell Seltzer for review of her available treatment options.  HLD (hyperlipidemia) Will request most recent lipid profile from primary M.D.'s office. When patient was last seen, I reduced Lipitor dose to 40 mg daily, in light of recent LDL of 65.  Hypertension Very well controlled on current regimen  Carotid bruit Nonobstructive bilateral ICA disease, by followup duplex imaging    Gene Kelyn Koskela, PAC

## 2012-08-12 NOTE — Patient Instructions (Signed)
Your physician wants you to follow-up in: 4 months with Gene Serpe, PA-C.  You will receive a reminder letter in the mail two months in advance. If you don't receive a letter, please call our office to schedule the follow-up appointment.

## 2012-08-12 NOTE — Assessment & Plan Note (Signed)
Nonobstructive bilateral ICA disease, by followup duplex imaging

## 2012-08-12 NOTE — Assessment & Plan Note (Signed)
Assessed as moderate, by echocardiography at [facility], 04/2012. Recommend continued close monitoring wityh surveillance echocardiograms, with plans to eventually refer her to Dr. Excell Seltzer for review of her available treatment options.

## 2012-08-12 NOTE — Assessment & Plan Note (Signed)
Very well controlled on current regimen

## 2012-08-12 NOTE — Assessment & Plan Note (Signed)
Remains quiescent on current medication regimen. Will provide prn NTG.

## 2012-08-12 NOTE — Assessment & Plan Note (Signed)
Will request most recent lipid profile from primary M.D.'s office. When patient was last seen, I reduced Lipitor dose to 40 mg daily, in light of recent LDL of 65.

## 2012-08-14 ENCOUNTER — Encounter: Payer: Self-pay | Admitting: *Deleted

## 2012-08-14 ENCOUNTER — Telehealth: Payer: Self-pay | Admitting: *Deleted

## 2012-08-14 DIAGNOSIS — I2510 Atherosclerotic heart disease of native coronary artery without angina pectoris: Secondary | ICD-10-CM

## 2012-08-14 DIAGNOSIS — E785 Hyperlipidemia, unspecified: Secondary | ICD-10-CM

## 2012-08-14 DIAGNOSIS — Z79899 Other long term (current) drug therapy: Secondary | ICD-10-CM

## 2012-08-14 NOTE — Telephone Encounter (Signed)
Per Inetta Fermo at Dr. Margaretmary Eddy office - do not see that she has had any recent labs.   I will go ahead and send letter and orders today for FLP, LFT.

## 2012-08-14 NOTE — Telephone Encounter (Signed)
Message copied by Lesle Chris on Wed Aug 14, 2012  2:37 PM ------      Message from: Lesle Chris      Created: Fri May 10, 2012  2:19 PM       FLP, LFT  ------

## 2012-12-05 ENCOUNTER — Encounter: Payer: Self-pay | Admitting: Cardiology

## 2012-12-05 ENCOUNTER — Ambulatory Visit (INDEPENDENT_AMBULATORY_CARE_PROVIDER_SITE_OTHER): Payer: Medicare Other | Admitting: Cardiology

## 2012-12-05 VITALS — BP 146/70 | HR 65 | Ht 61.0 in | Wt 126.4 lb

## 2012-12-05 DIAGNOSIS — I350 Nonrheumatic aortic (valve) stenosis: Secondary | ICD-10-CM

## 2012-12-05 DIAGNOSIS — I359 Nonrheumatic aortic valve disorder, unspecified: Secondary | ICD-10-CM

## 2012-12-05 NOTE — Progress Notes (Signed)
Clinical Summary Ms. Garrow is a 79 y.o.female seen here for follow up for the following problems  1. CAD - recent cath showed LAD 75% not amenable to PCI - denies any chest pain, no SOB or DOE - compliant with meds: ASA, atorva 40, lisinopril 10, metoprolol 50 bid  2. HTN - does not check at home - compliant with medications  3. Aortic stenosis - denies any significant SOB or DOE. No chest pain, no lightheadedness or dizziness. Fairly sedentary lifestyle, highest level of exertion is walking around the house and doing housework which she tolerates well   4. HL - compliant with statin - last panel 04/2012: TC 144 TG 65 HDL 62 LDL 69  5. Carotid bruit - recent normal [place] - denies any vision changes, no double vision, no slurred speech, no focal neurological changes  Past Medical History  Diagnosis Date  . Hypertension     a. on lisinopril x 4 yrs.  . Osteoarthritis      No Known Allergies   Current Outpatient Prescriptions  Medication Sig Dispense Refill  . aspirin EC 81 MG tablet Take 1 tablet (81 mg total) by mouth daily.      Marland Kitchen atorvastatin (LIPITOR) 40 MG tablet Take 1 tablet (40 mg total) by mouth every evening.  30 tablet  6  . furosemide (LASIX) 20 MG tablet Take 20 mg by mouth daily.      Marland Kitchen lisinopril (PRINIVIL,ZESTRIL) 20 MG tablet Take 0.5 tablets (10 mg total) by mouth daily.      . metoprolol tartrate (LOPRESSOR) 50 MG tablet Take 1 tablet (50 mg total) by mouth 2 (two) times daily.  60 tablet  6  . Multiple Vitamin (MULTIVITAMIN WITH MINERALS) TABS Take 1 tablet by mouth every morning.       . nitroGLYCERIN (NITROSTAT) 0.4 MG SL tablet Place 1 tablet (0.4 mg total) under the tongue every 5 (five) minutes as needed for chest pain.  25 tablet  3  . potassium chloride (K-DUR) 10 MEQ tablet Take 10 mEq by mouth daily.      . traMADol (ULTRAM) 50 MG tablet Take 50 mg by mouth every 8 (eight) hours as needed for pain. For pain       No current  facility-administered medications for this visit.     No past surgical history on file.   No Known Allergies    Family History  Problem Relation Age of Onset  . Other      non-contributory for premature cad     Social History Ms. Rosales reports that she has never smoked. She does not have any smokeless tobacco history on file. Ms. Denley reports that she does not drink alcohol.   Review of Systems CONSTITUTIONAL: No weight loss, fever, chills, weakness or fatigue.  HEENT: Eyes: No visual loss, blurred vision, double vision or yellow sclerae.No hearing loss, sneezing, congestion, runny nose or sore throat.  SKIN: No rash or itching.  CARDIOVASCULAR: per HPI RESPIRATORY: per HPI GASTROINTESTINAL: No anorexia, nausea, vomiting or diarrhea. No abdominal pain or blood.  GENITOURINARY: No burning on urination, no polyuria NEUROLOGICAL: No headache, dizziness, syncope, paralysis, ataxia, numbness or tingling in the extremities. No change in bowel or bladder control.  MUSCULOSKELETAL: No muscle, back pain, joint pain or stiffness.  LYMPHATICS: No enlarged nodes. No history of splenectomy.  PSYCHIATRIC: No history of depression or anxiety.  ENDOCRINOLOGIC: No reports of sweating, cold or heat intolerance. No polyuria or polydipsia.  Marland Kitchen  Physical Examination p 65 bp 146/70 Wt 126 lbs BMI 24 Gen: resting comfortably, no acute distress HEENT: no scleral icterus, pupils equal round and reactive, no palptable cervical adenopathy,  CV: RRR, 3/6 systolic midpeaking murmur RUSB, no JVD Resp: Clear to auscultation bilaterally GI: abdomen is soft, non-tender, non-distended, normal bowel sounds, no hepatosplenomegaly MSK: extremities are warm, no edema.  Skin: warm, no rash Neuro:  no focal deficits Psych: appropriate affect   Diagnostic Studies Cath 04/29/12 Procedural Findings:  Hemodynamics  RA 4  RV 37/4  PA 36/11  PCWP 12  LV not done  AO 150/57 (93)  Oxygen saturations:   SVC 67%  PA 64%  AO 96%  Cardiac Output (Fick) 4.91  Cardiac Index (Fick) 3.21  Cardiac Output (Thermo) 3.25  Cardiac Index (Thermo) 2.12  Coronary angiography:  Coronary dominance: right  Left mainstem: Mild calcification and normal  Left anterior descending (LAD): Mild irregularity and calcification proximally with about 40% narrowing. 40% narrowing at takeoff prior to second diagonal. 75% smooth narrowing in LAD after second diagonal.  Left circumflex (LCx): Provides a small ramus that is free of significant disease. The OM is large and has 30% smooth narrowing. Smaller AV circ without critical disease  Right coronary artery (RCA): Large caliber RCA providing a large PDA and moderate PLA.  Aortic root: Heavily calcified aortic valve with fairly limited mobility. Also moderate AI noted. Aortic root size is normal.  Left ventriculography: Not done  Final Conclusions:  1. Severe AS by echo 0.6 cm2  2. Moderately severe mid LAD stenosis not ideal for PCI.    Echo 05/23/12 LVEF 55%, moderate to severe AS (valve area 0.65 reported mean gradient 33 mmHg, dimensionless index 0.26), mild MR, mild LAE, PASP 40,   05/2012 Carotid US 0-39% bilateral, patent antegrade bilateral vertebrals      Assessment and Plan  1. Aortic stenosis - severe by AVA calculations, gradients don't match the calculated valve area. Unclear if possible technical issue with echo. She has has normal LV function and her LVIDd is measured at 4.5 cm with a stroke volume index of 38, dont see a clear reason to have a low gradient if the valve truly is that severe - she has no symptoms, but lives a very sedentary lifestyle - repeat echo to reevaluate valve area and gradient - she would be a candidate for intervention when the time is right, despite her age she is independent with relatively limited comorbidities  2. CAD - single vessel CAD by recent cath, not amenable to PCI - denies any significant chest pain - if  goes for AVR, would need to be evaluated for possible bypass as well.   3. HTN - at goal, continue current meds.  4. HL - at goal, continue current meds  F/u 1 month to review echo and reevaluate for symptoms  Antoine Poche, M.D., F.A.C.C.

## 2012-12-05 NOTE — Patient Instructions (Signed)
Your physician recommends that you schedule a follow-up appointment in: 1 month with Dr. Wyline Mood. This appointment will be scheduled today before you leave.  Your physician recommends that you continue on your current medications as directed. Please refer to the Current Medication list given to you today.  Your physician has requested that you have an echocardiogram. Echocardiography is a painless test that uses sound waves to create images of your heart. It provides your doctor with information about the size and shape of your heart and how well your heart's chambers and valves are working. This procedure takes approximately one hour. There are no restrictions for this procedure.

## 2012-12-19 ENCOUNTER — Other Ambulatory Visit: Payer: Medicare Other

## 2013-01-06 ENCOUNTER — Ambulatory Visit: Payer: Medicare Other | Admitting: Cardiology

## 2013-03-26 ENCOUNTER — Other Ambulatory Visit (INDEPENDENT_AMBULATORY_CARE_PROVIDER_SITE_OTHER): Payer: Medicare Other

## 2013-03-26 ENCOUNTER — Other Ambulatory Visit: Payer: Self-pay

## 2013-03-26 DIAGNOSIS — I359 Nonrheumatic aortic valve disorder, unspecified: Secondary | ICD-10-CM

## 2013-03-26 DIAGNOSIS — I350 Nonrheumatic aortic (valve) stenosis: Secondary | ICD-10-CM

## 2013-03-26 DIAGNOSIS — I059 Rheumatic mitral valve disease, unspecified: Secondary | ICD-10-CM

## 2013-03-27 ENCOUNTER — Telehealth: Payer: Self-pay | Admitting: Cardiology

## 2013-03-27 NOTE — Telephone Encounter (Signed)
Message copied by Burnice LoganATES, Adorian Gwynne M on Thu Mar 27, 2013 12:06 PM ------      Message from: FosterBRANCH, JONATHAN F      Created: Thu Mar 27, 2013 10:50 AM       Please let patient know that her echo shows her aortic valve is severely thickened and that it appears that her mitral valve is very leaky. We will discuss in further detail at the end of the month at her appointment, including possibly doing a different type of ultrasound from the stomach called a TEE            Dina RichJonathan Branch MD ------

## 2013-03-27 NOTE — Telephone Encounter (Signed)
Pt informed of results and verbalized understanding.  

## 2013-03-28 ENCOUNTER — Other Ambulatory Visit: Payer: Self-pay | Admitting: Physician Assistant

## 2013-04-04 ENCOUNTER — Other Ambulatory Visit: Payer: Self-pay | Admitting: Physician Assistant

## 2013-04-08 ENCOUNTER — Ambulatory Visit: Payer: Medicare Other | Admitting: Cardiology

## 2013-04-17 ENCOUNTER — Ambulatory Visit (INDEPENDENT_AMBULATORY_CARE_PROVIDER_SITE_OTHER): Payer: Medicare Other | Admitting: Cardiology

## 2013-04-17 ENCOUNTER — Encounter: Payer: Self-pay | Admitting: Cardiology

## 2013-04-17 VITALS — BP 149/78 | HR 65 | Ht 61.0 in | Wt 125.0 lb

## 2013-04-17 DIAGNOSIS — E785 Hyperlipidemia, unspecified: Secondary | ICD-10-CM

## 2013-04-17 DIAGNOSIS — I059 Rheumatic mitral valve disease, unspecified: Secondary | ICD-10-CM

## 2013-04-17 DIAGNOSIS — I340 Nonrheumatic mitral (valve) insufficiency: Secondary | ICD-10-CM

## 2013-04-17 DIAGNOSIS — I10 Essential (primary) hypertension: Secondary | ICD-10-CM

## 2013-04-17 DIAGNOSIS — I350 Nonrheumatic aortic (valve) stenosis: Secondary | ICD-10-CM

## 2013-04-17 DIAGNOSIS — I2510 Atherosclerotic heart disease of native coronary artery without angina pectoris: Secondary | ICD-10-CM

## 2013-04-17 DIAGNOSIS — I359 Nonrheumatic aortic valve disorder, unspecified: Secondary | ICD-10-CM

## 2013-04-17 NOTE — Progress Notes (Signed)
Clinical Summary [name] is a 80 y.o.female seen today for follow up of the following medical problems.   1. CAD  - 04/2012 cath showed LAD 75% described as not ideal for PCI - LVEF 55% by echo 05/2012 - denies any chest pain, no SOB or DOE  - compliant with meds  2. HTN  - does not check at home regularly - compliant with medications   3. Aortic stenosis  - denies any significant SOB or DOE. No chest pain, no lightheadedness or dizziness. Fairly sedentary lifestyle, highest level of exertion is walking around the house and doing housework which she tolerates well   4. HL  - compliant with statin  - last panel 04/2012: TC 144 TG 65 HDL 62 LDL 69   5. Carotid bruit  - recent normal US  - denies any vision changes, no double vision, no slurred speech, no focal neurological changes   Past Medical History  Diagnosis Date  . Hypertension     a. on lisinopril x 4 yrs.  . Osteoarthritis      No Known Allergies   Current Outpatient Prescriptions  Medication Sig Dispense Refill  . aspirin EC 81 MG tablet Take 1 tablet (81 mg total) by mouth daily.      Marland Kitchen. atorvastatin (LIPITOR) 40 MG tablet TAKE 1 TABLET IN THE EVENING.  30 tablet  6  . lisinopril (PRINIVIL,ZESTRIL) 20 MG tablet Take 0.5 tablets (10 mg total) by mouth daily.      . metoprolol (LOPRESSOR) 50 MG tablet TAKE (1) TABLET TWICE DAILY.  60 tablet  3  . Multiple Vitamin (MULTIVITAMIN WITH MINERALS) TABS Take 1 tablet by mouth every morning.       . nitroGLYCERIN (NITROSTAT) 0.4 MG SL tablet Place 1 tablet (0.4 mg total) under the tongue every 5 (five) minutes as needed for chest pain.  25 tablet  3  . potassium chloride (K-DUR) 10 MEQ tablet Take 10 mEq by mouth daily.      . traMADol (ULTRAM) 50 MG tablet Take 50 mg by mouth every 8 (eight) hours as needed for pain. For pain       No current facility-administered medications for this visit.     No past surgical history on file.   No Known  Allergies    Family History  Problem Relation Age of Onset  . Other      non-contributory for premature cad     Social History [name] reports that she has never smoked. She does not have any smokeless tobacco history on file. [name] reports that she does not drink alcohol.   Review of Systems CONSTITUTIONAL: No weight loss, fever, chills, weakness or fatigue.  HEENT: Eyes: No visual loss, blurred vision, double vision or yellow sclerae.No hearing loss, sneezing, congestion, runny nose or sore throat.  SKIN: No rash or itching.  CARDIOVASCULAR: per HPI RESPIRATORY: No shortness of breath, cough or sputum.  GASTROINTESTINAL: No anorexia, nausea, vomiting or diarrhea. No abdominal pain or blood.  GENITOURINARY: No burning on urination, no polyuria NEUROLOGICAL: No headache, dizziness, syncope, paralysis, ataxia, numbness or tingling in the extremities. No change in bowel or bladder control.  MUSCULOSKELETAL: No muscle, back pain, joint pain or stiffness.  LYMPHATICS: No enlarged nodes. No history of splenectomy.  PSYCHIATRIC: No history of depression or anxiety.  ENDOCRINOLOGIC: No reports of sweating, cold or heat intolerance. No polyuria or polydipsia.  Marland Kitchen.   Physical Examination p 65 bp 149/78 Wt  125 lbs BMI 24 Gen: resting comfortably, no acute distress HEENT: no scleral icterus, pupils equal round and reactive, no palptable cervical adenopathy,  CV: RRR, 3/6 mid peaking systolic murmur RUSB, no JVD, +right cartoid bruits Resp: Clear to auscultation bilaterally GI: abdomen is soft, non-tender, non-distended, normal bowel sounds, no hepatosplenomegaly MSK: extremities are warm, no edema.  Skin: warm, no rash Neuro:  no focal deficits Psych: appropriate affect   Diagnostic Studies Cath 04/29/12  Procedural Findings:  Hemodynamics  RA 4  RV 37/4  PA 36/11  PCWP 12  LV not done  AO 150/57 (93)  Oxygen saturations:  SVC 67%  PA 64%  AO 96%  Cardiac Output  (Fick) 4.91  Cardiac Index (Fick) 3.21  Cardiac Output (Thermo) 3.25  Cardiac Index (Thermo) 2.12  Coronary angiography:  Coronary dominance: right  Left mainstem: Mild calcification and normal  Left anterior descending (LAD): Mild irregularity and calcification proximally with about 40% narrowing. 40% narrowing at takeoff prior to second diagonal. 75% smooth narrowing in LAD after second diagonal.  Left circumflex (LCx): Provides a small ramus that is free of significant disease. The OM is large and has 30% smooth narrowing. Smaller AV circ without critical disease  Right coronary artery (RCA): Large caliber RCA providing a large PDA and moderate PLA.  Aortic root: Heavily calcified aortic valve with fairly limited mobility. Also moderate AI noted. Aortic root size is normal.  Left ventriculography: Not done  Final Conclusions:  1. Severe AS by echo 0.6 cm2  2. Moderately severe mid LAD stenosis not ideal for PCI.   Echo 05/23/12  LVEF 55%, moderate to severe AS (valve area 0.65 reported mean gradient 33 mmHg, dimensionless index 0.26), mild MR, mild LAE, PASP 40,   05/2012 Carotid US  0-39% bilateral, patent antegrade bilateral vertebrals   03/2013 Echo LVEF 50-55%, mild LVH, no WMAs, grade II diastolic dysfuction, severe AS (mean grad 45 mmHg, AVA 0.55 by VTI, severe MR, PASP 47,  LVIDs 33 mmHg.     Assessment and Plan  1. Aortic stenosis  - severe by most recent echo, she denies any significant symptoms however lives a fairly sedentary lifestyle. LVEF 50-55% - despite her age, she is independent with fairly limited comorbidities - given her coexisting MV disease and CAD, she would likely require surgery as opposed to TAVR. - will arrange TEE to further evaluate MR, pending results likely refer to surgery. If she is found to be a reasonable candidate, will repeat LHC/RHC at that time.    2. CAD  - single vessel CAD by recent cath, not ideal for PCI per notes - denies any  significant chest pain  - if goes for AVR, would need to be evaluated for possible bypass as well.   3. HTN  - at goal, continue current meds.   4. HL  - at goal, continue current meds  5. Mitral regurgitation - described as severe on recent TTE. Normal chamber diamters, LVEF 50-55% - will discuss further possible TEE.   Follow up 6 month with me. Will discuss TEE with her in more detail, if agrees will arrange.    Antoine PocheJonathan F. Yanissa Michalsky, M.D., F.A.C.C.

## 2013-04-17 NOTE — Patient Instructions (Signed)
Your physician recommends that you schedule a follow-up appointment in: 6 months with Dr. Wyline MoodBranch. You should receive a letter in the mail in 4 months. If you do not receive this letter by July 2015 call our office to schedule this appointment.   Your physician recommends that you continue on your current medications as directed. Please refer to the Current Medication list given to you today.  Your physician has referred you to Dr. Copper.

## 2013-04-20 DIAGNOSIS — I340 Nonrheumatic mitral (valve) insufficiency: Secondary | ICD-10-CM | POA: Insufficient documentation

## 2013-04-21 ENCOUNTER — Telehealth: Payer: Self-pay | Admitting: Cardiology

## 2013-04-21 ENCOUNTER — Encounter: Payer: Self-pay | Admitting: Cardiology

## 2013-04-21 NOTE — Telephone Encounter (Signed)
Pt is scheduled for a TEE on Tuesday 04-29-13 with Dr. Eden EmmsNishan at 9:00. Checking percert

## 2013-04-21 NOTE — Telephone Encounter (Signed)
Informed pt son that TEE was scheduled for Tuesday March 17 at 9:00 and that they must be at hospital at 7:30 that morning. Pt son to come by and pick up instruction sheet.

## 2013-04-28 ENCOUNTER — Telehealth: Payer: Self-pay | Admitting: Cardiology

## 2013-04-28 NOTE — Telephone Encounter (Signed)
Informed pt that time has been changed again and that she needs to arrive at 12:30. Pt verbalized understanding.

## 2013-04-28 NOTE — Telephone Encounter (Signed)
Called and informed pt that TEE had been rescheduled tomorrow to arrive at 11:30 instead at 7:30. Pt verbalized understanding.

## 2013-04-29 ENCOUNTER — Encounter (HOSPITAL_COMMUNITY): Payer: Self-pay | Admitting: *Deleted

## 2013-04-29 ENCOUNTER — Ambulatory Visit (HOSPITAL_COMMUNITY)
Admission: RE | Admit: 2013-04-29 | Discharge: 2013-04-29 | Disposition: A | Discharge status: Home / Self Care | Payer: Medicare Other | Source: Ambulatory Visit | Attending: Cardiovascular Disease | Admitting: Cardiovascular Disease

## 2013-04-29 ENCOUNTER — Encounter (HOSPITAL_COMMUNITY)
Admission: RE | Disposition: A | Discharge status: Home / Self Care | Payer: Self-pay | Source: Ambulatory Visit | Attending: Cardiovascular Disease

## 2013-04-29 DIAGNOSIS — I080 Rheumatic disorders of both mitral and aortic valves: Secondary | ICD-10-CM | POA: Insufficient documentation

## 2013-04-29 DIAGNOSIS — I10 Essential (primary) hypertension: Secondary | ICD-10-CM | POA: Insufficient documentation

## 2013-04-29 DIAGNOSIS — Z7982 Long term (current) use of aspirin: Secondary | ICD-10-CM | POA: Insufficient documentation

## 2013-04-29 DIAGNOSIS — I2510 Atherosclerotic heart disease of native coronary artery without angina pectoris: Secondary | ICD-10-CM | POA: Insufficient documentation

## 2013-04-29 DIAGNOSIS — M1990 Unspecified osteoarthritis, unspecified site: Secondary | ICD-10-CM | POA: Insufficient documentation

## 2013-04-29 DIAGNOSIS — I059 Rheumatic mitral valve disease, unspecified: Secondary | ICD-10-CM

## 2013-04-29 DIAGNOSIS — R0989 Other specified symptoms and signs involving the circulatory and respiratory systems: Secondary | ICD-10-CM | POA: Insufficient documentation

## 2013-04-29 DIAGNOSIS — E785 Hyperlipidemia, unspecified: Secondary | ICD-10-CM | POA: Insufficient documentation

## 2013-04-29 HISTORY — PX: TEE WITHOUT CARDIOVERSION: SHX5443

## 2013-04-29 SURGERY — ECHOCARDIOGRAM, TRANSESOPHAGEAL
Anesthesia: Moderate Sedation

## 2013-04-29 MED ORDER — MIDAZOLAM HCL 5 MG/ML IJ SOLN
INTRAMUSCULAR | Status: AC
  Filled 2013-04-29: qty 2

## 2013-04-29 MED ORDER — BUTAMBEN-TETRACAINE-BENZOCAINE 2-2-14 % EX AERO
INHALATION_SPRAY | CUTANEOUS | Status: DC | PRN
  Administered 2013-04-29: 2 via TOPICAL

## 2013-04-29 MED ORDER — DIPHENHYDRAMINE HCL 50 MG/ML IJ SOLN
INTRAMUSCULAR | Status: AC
  Filled 2013-04-29: qty 1

## 2013-04-29 MED ORDER — MIDAZOLAM HCL 10 MG/2ML IJ SOLN
INTRAMUSCULAR | Status: DC | PRN
Start: 2013-04-29 — End: 2013-04-29
  Administered 2013-04-29 (×2): 2 mg via INTRAVENOUS

## 2013-04-29 MED ORDER — FENTANYL CITRATE 0.05 MG/ML IJ SOLN
INTRAMUSCULAR | Status: DC | PRN
  Administered 2013-04-29: 25 ug via INTRAVENOUS

## 2013-04-29 MED ORDER — SODIUM CHLORIDE 0.9 % IV SOLN
INTRAVENOUS | Status: DC
  Administered 2013-04-29: 500 mL via INTRAVENOUS

## 2013-04-29 MED ORDER — FENTANYL CITRATE 0.05 MG/ML IJ SOLN
INTRAMUSCULAR | Status: AC
  Filled 2013-04-29: qty 2

## 2013-04-29 NOTE — Progress Notes (Signed)
Echo Lab  Transesophageal Echocardiogram completed.  Ernan Runkles L Landen Knoedler, RDCS 04/29/2013 1:34 PM

## 2013-04-29 NOTE — Discharge Instructions (Signed)
F/U with Dr Wyline MoodBranch to discuss results Son to drive home

## 2013-04-29 NOTE — Interval H&P Note (Signed)
History and Physical Interval Note:  04/29/2013 12:50 PM  Rita Walters  has presented today for surgery, with the diagnosis of MITRAL REGURGITIATION  The various methods of treatment have been discussed with the patient and family. After consideration of risks, benefits and other options for treatment, the patient has consented to  Procedure(s): TRANSESOPHAGEAL ECHOCARDIOGRAM (TEE) (N/A) as a surgical intervention .  The patient's history has been reviewed, patient examined, no change in status, stable for surgery.  I have reviewed the patient's chart and labs.  Questions were answered to the patient's satisfaction.     Charlton HawsPeter Kristan Brummitt

## 2013-04-29 NOTE — H&P (View-Only) (Signed)
Clinical Summary [name] is a 80 y.o.female seen today for follow up of the following medical problems.   1. CAD  - 04/2012 cath showed LAD 75% described as not ideal for PCI - LVEF 55% by echo 05/2012 - denies any chest pain, no SOB or DOE  - compliant with meds  2. HTN  - does not check at home regularly - compliant with medications   3. Aortic stenosis  - denies any significant SOB or DOE. No chest pain, no lightheadedness or dizziness. Fairly sedentary lifestyle, highest level of exertion is walking around the house and doing housework which she tolerates well   4. HL  - compliant with statin  - last panel 04/2012: TC 144 TG 65 HDL 62 LDL 69   5. Carotid bruit  - recent normal US  - denies any vision changes, no double vision, no slurred speech, no focal neurological changes   Past Medical History  Diagnosis Date  . Hypertension     a. on lisinopril x 4 yrs.  . Osteoarthritis      No Known Allergies   Current Outpatient Prescriptions  Medication Sig Dispense Refill  . aspirin EC 81 MG tablet Take 1 tablet (81 mg total) by mouth daily.      Marland Kitchen. atorvastatin (LIPITOR) 40 MG tablet TAKE 1 TABLET IN THE EVENING.  30 tablet  6  . lisinopril (PRINIVIL,ZESTRIL) 20 MG tablet Take 0.5 tablets (10 mg total) by mouth daily.      . metoprolol (LOPRESSOR) 50 MG tablet TAKE (1) TABLET TWICE DAILY.  60 tablet  3  . Multiple Vitamin (MULTIVITAMIN WITH MINERALS) TABS Take 1 tablet by mouth every morning.       . nitroGLYCERIN (NITROSTAT) 0.4 MG SL tablet Place 1 tablet (0.4 mg total) under the tongue every 5 (five) minutes as needed for chest pain.  25 tablet  3  . potassium chloride (K-DUR) 10 MEQ tablet Take 10 mEq by mouth daily.      . traMADol (ULTRAM) 50 MG tablet Take 50 mg by mouth every 8 (eight) hours as needed for pain. For pain       No current facility-administered medications for this visit.     No past surgical history on file.   No Known  Allergies    Family History  Problem Relation Age of Onset  . Other      non-contributory for premature cad     Social History [name] reports that she has never smoked. She does not have any smokeless tobacco history on file. [name] reports that she does not drink alcohol.   Review of Systems CONSTITUTIONAL: No weight loss, fever, chills, weakness or fatigue.  HEENT: Eyes: No visual loss, blurred vision, double vision or yellow sclerae.No hearing loss, sneezing, congestion, runny nose or sore throat.  SKIN: No rash or itching.  CARDIOVASCULAR: per HPI RESPIRATORY: No shortness of breath, cough or sputum.  GASTROINTESTINAL: No anorexia, nausea, vomiting or diarrhea. No abdominal pain or blood.  GENITOURINARY: No burning on urination, no polyuria NEUROLOGICAL: No headache, dizziness, syncope, paralysis, ataxia, numbness or tingling in the extremities. No change in bowel or bladder control.  MUSCULOSKELETAL: No muscle, back pain, joint pain or stiffness.  LYMPHATICS: No enlarged nodes. No history of splenectomy.  PSYCHIATRIC: No history of depression or anxiety.  ENDOCRINOLOGIC: No reports of sweating, cold or heat intolerance. No polyuria or polydipsia.  Marland Kitchen.   Physical Examination p 65 bp 149/78 Wt  125 lbs BMI 24 Gen: resting comfortably, no acute distress HEENT: no scleral icterus, pupils equal round and reactive, no palptable cervical adenopathy,  CV: RRR, 3/6 mid peaking systolic murmur RUSB, no JVD, +right cartoid bruits Resp: Clear to auscultation bilaterally GI: abdomen is soft, non-tender, non-distended, normal bowel sounds, no hepatosplenomegaly MSK: extremities are warm, no edema.  Skin: warm, no rash Neuro:  no focal deficits Psych: appropriate affect   Diagnostic Studies Cath 04/29/12  Procedural Findings:  Hemodynamics  RA 4  RV 37/4  PA 36/11  PCWP 12  LV not done  AO 150/57 (93)  Oxygen saturations:  SVC 67%  PA 64%  AO 96%  Cardiac Output  (Fick) 4.91  Cardiac Index (Fick) 3.21  Cardiac Output (Thermo) 3.25  Cardiac Index (Thermo) 2.12  Coronary angiography:  Coronary dominance: right  Left mainstem: Mild calcification and normal  Left anterior descending (LAD): Mild irregularity and calcification proximally with about 40% narrowing. 40% narrowing at takeoff prior to second diagonal. 75% smooth narrowing in LAD after second diagonal.  Left circumflex (LCx): Provides a small ramus that is free of significant disease. The OM is large and has 30% smooth narrowing. Smaller AV circ without critical disease  Right coronary artery (RCA): Large caliber RCA providing a large PDA and moderate PLA.  Aortic root: Heavily calcified aortic valve with fairly limited mobility. Also moderate AI noted. Aortic root size is normal.  Left ventriculography: Not done  Final Conclusions:  1. Severe AS by echo 0.6 cm2  2. Moderately severe mid LAD stenosis not ideal for PCI.   Echo 05/23/12  LVEF 55%, moderate to severe AS (valve area 0.65 reported mean gradient 33 mmHg, dimensionless index 0.26), mild MR, mild LAE, PASP 40,   05/2012 Carotid US  0-39% bilateral, patent antegrade bilateral vertebrals   03/2013 Echo LVEF 50-55%, mild LVH, no WMAs, grade II diastolic dysfuction, severe AS (mean grad 45 mmHg, AVA 0.55 by VTI, severe MR, PASP 47,  LVIDs 33 mmHg.     Assessment and Plan  1. Aortic stenosis  - severe by most recent echo, she denies any significant symptoms however lives a fairly sedentary lifestyle. LVEF 50-55% - despite her age, she is independent with fairly limited comorbidities - given her coexisting MV disease and CAD, she would likely require surgery as opposed to TAVR. - will arrange TEE to further evaluate MR, pending results likely refer to surgery. If she is found to be a reasonable candidate, will repeat LHC/RHC at that time.    2. CAD  - single vessel CAD by recent cath, not ideal for PCI per notes - denies any  significant chest pain  - if goes for AVR, would need to be evaluated for possible bypass as well.   3. HTN  - at goal, continue current meds.   4. HL  - at goal, continue current meds  5. Mitral regurgitation - described as severe on recent TTE. Normal chamber diamters, LVEF 50-55% - will discuss further possible TEE.   Follow up 6 month with me. Will discuss TEE with her in more detail, if agrees will arrange.    Antoine PocheJonathan F. Seneca Hoback, M.D., F.A.C.C.

## 2013-04-30 ENCOUNTER — Encounter (HOSPITAL_COMMUNITY): Payer: Self-pay | Admitting: Cardiovascular Disease

## 2013-04-30 NOTE — CV Procedure (Signed)
TEE:  See full report in [facility] EF 65% Moderate LAE Normal RV Moderate TR Severe AS and mild AR Mitral valve with no prolapse MAC with restricted leaflet motion posterior leaflet more Moderate MR ERO .27cm2 and Regurgitant volume 40 cc No ASD No LAA thrombus  Given age and frailty may consider TAVR for AS and MR grade would likey decrease by one level  [employer]ions Financial CorporationPeter Anhthu Perdew

## 2013-05-05 NOTE — Telephone Encounter (Signed)
Pt has [employer] MCR Complete.  No precert required

## 2013-06-02 ENCOUNTER — Telehealth: Payer: Self-pay | Admitting: Cardiology

## 2013-06-02 NOTE — Telephone Encounter (Signed)
Mrs. Rita Walters had a TEE done in March by Dr. Eden EmmsNishan.  When does she need to come back to see Dr. Wyline MoodBranch to get those result?

## 2013-06-02 NOTE — Telephone Encounter (Signed)
Please add her to my next Eden week. Her TEE shows that her mitral valve is moderately leaky, we need to discuss in further detail at our appointment.   Rita RichJonathan Bohdi Leeds MD

## 2013-06-02 NOTE — Telephone Encounter (Signed)
Pt son informed of below and agreed to an appointment for May 5 @ 8:20 AM

## 2013-06-04 ENCOUNTER — Institutional Professional Consult (permissible substitution): Payer: Medicare Other | Admitting: Cardiovascular Disease

## 2013-06-17 ENCOUNTER — Encounter: Payer: Self-pay | Admitting: Cardiology

## 2013-06-17 ENCOUNTER — Ambulatory Visit (INDEPENDENT_AMBULATORY_CARE_PROVIDER_SITE_OTHER): Payer: Medicare Other | Admitting: Cardiology

## 2013-06-17 VITALS — BP 149/73 | HR 63 | Ht 61.0 in | Wt 125.0 lb

## 2013-06-17 DIAGNOSIS — I2510 Atherosclerotic heart disease of native coronary artery without angina pectoris: Secondary | ICD-10-CM

## 2013-06-17 DIAGNOSIS — E785 Hyperlipidemia, unspecified: Secondary | ICD-10-CM

## 2013-06-17 DIAGNOSIS — I359 Nonrheumatic aortic valve disorder, unspecified: Secondary | ICD-10-CM

## 2013-06-17 DIAGNOSIS — I350 Nonrheumatic aortic (valve) stenosis: Secondary | ICD-10-CM

## 2013-06-17 DIAGNOSIS — I340 Nonrheumatic mitral (valve) insufficiency: Secondary | ICD-10-CM

## 2013-06-17 DIAGNOSIS — I059 Rheumatic mitral valve disease, unspecified: Secondary | ICD-10-CM

## 2013-06-17 NOTE — Progress Notes (Signed)
Clinical Summary [name] is a 80 y.o.female seen today for follow up of the following medical problems.   1. CAD  - 04/2012 cath showed LAD 75% described as not ideal for PCI  - LVEF 55% by echo 05/2012  - denies any chest pain, no SOB or DOE  - compliant with meds   2. HTN  - does not check at home regularly  - compliant with medications   3. Aortic stenosis  - TTE 03/2013 mean grad 45, AVA 0.55 by VTI - denies any significant SOB or DOE though fairly sedentary lifestyle . No chest pain, no lightheadedness or dizziness. Fairly sedentary lifestyle, highest level of exertion is walking around the house and doing housework which she tolerates well   4. Mitral regurgitation - described as severe by TTE - TEE 04/2013 moderate MR, ERO 0.27. - denies any symptoms as described above - by TTE LVIDs 3.3cm, PASP no reported, RA-RV pressure 47 mmHg, with normal IVC should be approx 50 mmHg.   4. HL  - compliant with statin  - last panel 04/2012: TC 144 TG 65 HDL 62 LDL 69     Past Medical History  Diagnosis Date  . Hypertension     a. on lisinopril x 4 yrs.  . Osteoarthritis      No Known Allergies   Current Outpatient Prescriptions  Medication Sig Dispense Refill  . aspirin EC 81 MG tablet Take 1 tablet (81 mg total) by mouth daily.      Marland Kitchen. atorvastatin (LIPITOR) 40 MG tablet TAKE 1 TABLET IN THE EVENING.  30 tablet  6  . lisinopril (PRINIVIL,ZESTRIL) 20 MG tablet Take 20 mg by mouth daily.      . metoprolol (LOPRESSOR) 50 MG tablet TAKE (1) TABLET TWICE DAILY.  60 tablet  3  . Multiple Vitamin (MULTIVITAMIN WITH MINERALS) TABS Take 1 tablet by mouth every morning.       . nitroGLYCERIN (NITROSTAT) 0.4 MG SL tablet Place 1 tablet (0.4 mg total) under the tongue every 5 (five) minutes as needed for chest pain.  25 tablet  3  . traMADol (ULTRAM) 50 MG tablet Take 50 mg by mouth every 8 (eight) hours as needed for pain. For pain       No current facility-administered  medications for this visit.     Past Surgical History  Procedure Laterality Date  . Tee without cardioversion N/A 04/29/2013    Procedure: TRANSESOPHAGEAL ECHOCARDIOGRAM (TEE);  Surgeon: Wendall StadePeter C Nishan, MD;  Location: [facility] END[facility]COPY;  Service: Cardiovascular;  Laterality: N/A;     No Known Allergies    Family History  Problem Relation Age of Onset  . Other      non-contributory for premature cad     Social History [name] reports that she has never smoked. She has never used smokeless tobacco. [name] reports that she does not drink alcohol.   Review of Systems CONSTITUTIONAL: No weight loss, fever, chills, weakness or fatigue.  HEENT: Eyes: No visual loss, blurred vision, double vision or yellow sclerae.No hearing loss, sneezing, congestion, runny nose or sore throat.  SKIN: No rash or itching.  CARDIOVASCULAR: per HPI RESPIRATORY: No shortness of breath, cough or sputum.  GASTROINTESTINAL: No anorexia, nausea, vomiting or diarrhea. No abdominal pain or blood.  GENITOURINARY: No burning on urination, no polyuria NEUROLOGICAL: No headache, dizziness, syncope, paralysis, ataxia, numbness or tingling in the extremities. No change in bowel or bladder control.  MUSCULOSKELETAL: No muscle,  back pain, joint pain or stiffness.  LYMPHATICS: No enlarged nodes. No history of splenectomy.  PSYCHIATRIC: No history of depression or anxiety.  ENDOCRINOLOGIC: No reports of sweating, cold or heat intolerance. No polyuria or polydipsia.  Marland Kitchen.   Physical Examination p 63 bp 149/73 Wt 125 lbs BMI 24 Gen: resting comfortably, no acute distress HEENT: no scleral icterus, pupils equal round and reactive, no palptable cervical adenopathy,  CV: RRR, 3/6 mid peaking systolic murmur RUSB, 2/6 systolic apical murmur, no JVD Resp: Clear to auscultation bilaterally GI: abdomen is soft, non-tender, non-distended, normal bowel sounds, no hepatosplenomegaly MSK: extremities are warm, no edema.    Skin: warm, no rash Neuro:  no focal deficits Psych: appropriate affect   Diagnostic Studies Cath 04/29/12  Procedural Findings:  Hemodynamics  RA 4  RV 37/4  PA 36/11  PCWP 12  LV not done  AO 150/57 (93)  Oxygen saturations:  SVC 67%  PA 64%  AO 96%  Cardiac Output (Fick) 4.91  Cardiac Index (Fick) 3.21  Cardiac Output (Thermo) 3.25  Cardiac Index (Thermo) 2.12  Coronary angiography:  Coronary dominance: right  Left mainstem: Mild calcification and normal  Left anterior descending (LAD): Mild irregularity and calcification proximally with about 40% narrowing. 40% narrowing at takeoff prior to second diagonal. 75% smooth narrowing in LAD after second diagonal.  Left circumflex (LCx): Provides a small ramus that is free of significant disease. The OM is large and has 30% smooth narrowing. Smaller AV circ without critical disease  Right coronary artery (RCA): Large caliber RCA providing a large PDA and moderate PLA.  Aortic root: Heavily calcified aortic valve with fairly limited mobility. Also moderate AI noted. Aortic root size is normal.  Left ventriculography: Not done  Final Conclusions:  1. Severe AS by echo 0.6 cm2  2. Moderately severe mid LAD stenosis not ideal for PCI.   Echo 05/23/12  LVEF 55%, moderate to severe AS (valve area 0.65 reported mean gradient 33 mmHg, dimensionless index 0.26), mild MR, mild LAE, PASP 40,  05/2012 Carotid US  0-39% bilateral, patent antegrade bilateral vertebrals   03/2013 Echo  LVEF 50-55%, mild LVH, no WMAs, grade II diastolic dysfuction, severe AS (mean grad 45 mmHg, AVA 0.55 by VTI, severe MR, PASP 47, LVIDs 33 mmHg.      Assessment and Plan   1. Aortic stenosis  - severe by most recent echo (critical by reported area), she denies any significant symptoms however lives a fairly sedentary lifestyle. LVEF 50-55%  - despite her age, she is independent with fairly limited comorbidities  - she has coexsting moderate MR and an LAD  lesion from a 2014 cath as well. The MR may be related to increased LV pressures from AS - will refer to CT surgery for further evaluation, despite her age she may be a reasonable surgical candidate. Will get there recs on her candidacy. If candidate, she likely will need repeat LHC/RHC which we can arrange.   2. CAD  - single vessel CAD by recent cath, not ideal for PCI per notes  - denies any significant chest pain  - if goes for AVR, would need to be evaluated for possible bypass as well.   3. HTN  - at goal, continue current meds.   4. HL  - at goal, continue current meds   5. Mitral regurgitation  - described as severe on recent TTE. Normal chamber diamters, LVEF 50-55%. Moderate pulm HTN. TEE shows moderate MR, potentially related to severe  AS - will be evaluated by surgery of AV, will f/u there thoughts on MV as well.     F/u 4 months  Antoine Poche, M.D., F.A.C.C.

## 2013-06-17 NOTE — Patient Instructions (Signed)
   Referral to [facility]  Continue all current medications. Follow up in  4 months

## 2013-06-25 ENCOUNTER — Encounter: Payer: Self-pay | Admitting: Surgery

## 2013-06-25 ENCOUNTER — Institutional Professional Consult (permissible substitution) (INDEPENDENT_AMBULATORY_CARE_PROVIDER_SITE_OTHER): Payer: Medicare Other | Admitting: Surgery

## 2013-06-25 VITALS — BP 140/70 | HR 70 | Resp 20 | Ht 61.0 in | Wt 125.0 lb

## 2013-06-25 DIAGNOSIS — I350 Nonrheumatic aortic (valve) stenosis: Secondary | ICD-10-CM

## 2013-06-25 DIAGNOSIS — I359 Nonrheumatic aortic valve disorder, unspecified: Secondary | ICD-10-CM

## 2013-06-26 ENCOUNTER — Encounter: Payer: Self-pay | Admitting: Surgery

## 2013-06-26 NOTE — Progress Notes (Signed)
PCP is Kirstie Peri, MD Referring Provider is Branch, Dorothe Pea, MD  Chief Complaint  Patient presents with  . Aortic Stenosis    Surgical eval, TEE 04/29/13, Cardiac Cath 04/30/13     HPI:  The patient is an 80 year old woman with a history of hypertension and hyperlipidemia, known CAD s/p cath in 04/2012 after presenting with some vague right arm pain. This showed a 75% Mid LAD stenosis that was  not felt to be ideal for PCI. She has a known history of aortic stenosis and her most recent 2D echo in 03/2013 showed severe AS with a mean gradient of 45 mm Hg and an AVA of 0.55 by VTI. There was severe MR, grade II diastolic dysfunction and an LVEF of 50-55%. A TEE was done by Dr. Eden Emms on 04/30/2013 which showed severe AS and mild AI. There was moderate MR and TR with an LVEF of 65%. She is not very active due to degenerative arthritis but has noticed some fatigue and occasional mild dizziness. Past Medical History  Diagnosis Date  . Hypertension     a. on lisinopril x 4 yrs.  . Osteoarthritis     Past Surgical History  Procedure Laterality Date  . Tee without cardioversion N/A 04/29/2013    Procedure: TRANSESOPHAGEAL ECHOCARDIOGRAM (TEE);  Surgeon: Wendall Stade, MD;  Location: [facility] ENDOSCOPY;  Service: Cardiovascular;  Laterality: N/A;    Family History  Problem Relation Age of Onset  . Other      non-contributory for premature cad    Social History History  Substance Use Topics  . Smoking status: Never Smoker   . Smokeless tobacco: Never Used  . Alcohol Use: No    Current Outpatient Prescriptions  Medication Sig Dispense Refill  . aspirin EC 81 MG tablet Take 1 tablet (81 mg total) by mouth daily.      Marland Kitchen atorvastatin (LIPITOR) 40 MG tablet TAKE 1 TABLET IN THE EVENING.  30 tablet  6  . lisinopril (PRINIVIL,ZESTRIL) 20 MG tablet Take 20 mg by mouth daily.      . meclizine (ANTIVERT) 12.5 MG tablet Take 1 tablet by mouth every 8 (eight) hours as needed.      .  metoprolol (LOPRESSOR) 50 MG tablet TAKE (1) TABLET TWICE DAILY.  60 tablet  3  . Multiple Vitamin (MULTIVITAMIN WITH MINERALS) TABS Take 1 tablet by mouth every morning.       . nitroGLYCERIN (NITROSTAT) 0.4 MG SL tablet Place 1 tablet (0.4 mg total) under the tongue every 5 (five) minutes as needed for chest pain.  25 tablet  3  . traMADol (ULTRAM) 50 MG tablet Take 50 mg by mouth every 8 (eight) hours as needed for pain. For pain       No current facility-administered medications for this visit.    No Known Allergies  Review of Systems  Constitutional: Positive for fatigue. Negative for fever, activity change, appetite change and unexpected weight change.  HENT: Negative.   Eyes: Negative.   Respiratory: Negative for cough, chest tightness and shortness of breath.   Cardiovascular: Negative for chest pain, palpitations and leg swelling.  Gastrointestinal: Negative.   Endocrine: Negative.   Genitourinary: Negative.   Musculoskeletal: Positive for arthralgias and gait problem.  Skin: Negative.   Allergic/Immunologic: Negative.   Neurological: Positive for dizziness.  Hematological: Negative.   Psychiatric/Behavioral: Negative.     BP 140/70  Pulse 70  Resp 20  Ht 5\' 1"  (1.549 m)  Wt 125 lb (56.7 kg)  BMI 23.63 kg/m2  SpO2 96% Physical Exam  Constitutional: She is oriented to person, place, and time.  Elderly, frail-appearing woman walking slowly with a cane  HENT:  Head: Normocephalic and atraumatic.  Mouth/Throat: Oropharynx is clear and moist.  Eyes: EOM are normal. Pupils are equal, round, and reactive to light.  Neck: Normal range of motion. Neck supple. No JVD present. No thyromegaly present.  Cardiovascular: Normal rate and regular rhythm.   Murmur heard. III/VI systolic murmur along RSB  Pulmonary/Chest: Effort normal. No respiratory distress. She has no rales. She exhibits no tenderness.  Abdominal: Soft. Bowel sounds are normal. She exhibits no distension and  no mass. There is no tenderness.  Musculoskeletal: She exhibits no edema.  Neurological: She is alert and oriented to person, place, and time. No cranial nerve deficit.  Skin: Skin is warm and dry.  Psychiatric: She has a normal mood and affect.     Diagnostic Tests:  ** *[facility]* [address] [place], [place] [address] [phone]  ------------------------------------------------------------ Transesophageal Echocardiography  Patient: Theola SequinYeatts, Radie W MR #: [id number] Study Date: 04/29/2013 Gender: F Age: [age] Height: Weight: 56.8kg BSA: Pt. Status: [facility]  ADMITTING Charlton HawsNishan, Peter ATTENDING Charlton HawsNishan, Peter PERFORMING Layla MawNishan, Peter ORDERING Dunn, Dayna REFERRING Dunn, Dayna SONOGRAPHER Melissa Morford, RDCS cc:  ------------------------------------------------------------ LV EF: 55% - 60%  ------------------------------------------------------------ Indications: Mitral regurgitation 424.0.  ------------------------------------------------------------ History: PMH: Coronary artery disease. Risk factors: Hypertension. Dyslipidemia.  ------------------------------------------------------------ Study Conclusions  - Left ventricle: Hypertrophy was noted. Systolic function was normal. The estimated ejection fraction was in the range of 55% to 60%. - Aortic valve: Heavily calcified AV. Unable to accurately trace valve area. Severe aortic stenosis. Milld aortic regurgitation - Mitral valve: MAC- with overall moderate mitral regurgitation. ERO of .27cm2 and Regurgitant volume of 40cc both in moderate range. Mechanism likely restricted motion of posterior leaflet No prolapse - Left atrium: The atrium was dilated. - Right atrium: No evidence of thrombus in the atrial cavity or appendage. - Atrial septum: No defect or patent foramen ovale was identified. Echo contrast study showed no right-to-left atrial level shunt, following an  increase in RA pressure induced by provocative maneuvers. - Tricuspid valve: Moderate regurgitation. - Impressions: Given patient age and fraily may look into TAVR procedure for severe AS and MR likely to improve a grade after AS relieved Impressions:  - Given patient age and fraily may look into TAVR procedure for severe AS and MR likely to improve a grade after AS relieved Transesophageal echocardiography. 2D and color Doppler. Weight: Weight: 56.8kg. Weight: 125lb. Blood pressure: 142/54. Patient status: Outpatient. Location: Endoscopy.  ------------------------------------------------------------  ------------------------------------------------------------ Left ventricle: Hypertrophy was noted. Systolic function was normal. The estimated ejection fraction was in the range of 55% to 60%.  ------------------------------------------------------------ Aortic valve: Heavily calcified AV. Unable to accurately trace valve area. Severe aortic stenosis. Milld aortic regurgitation Doppler: Peak velocity ratio of LVOT to aortic valve: 0.47. Mean gradient: 42mm Hg (S). Peak gradient: 61mm Hg (S).  ------------------------------------------------------------ Mitral valve: MAC- with overall moderate mitral regurgitation. ERO of .27cm2 and Regurgitant volume of 40cc both in moderate range. Mechanism likely restricted motion of posterior leaflet No prolapse  ------------------------------------------------------------ Left atrium: The atrium was dilated.  ------------------------------------------------------------ Atrial septum: No defect or patent foramen ovale was identified. Echo contrast study showed no right-to-left atrial level shunt, following an increase in RA pressure induced by provocative maneuvers.  ------------------------------------------------------------ Right ventricle: The cavity size was normal. Wall  thickness was normal. Systolic function was  normal.  ------------------------------------------------------------ Tricuspid valve: Doppler: Moderate regurgitation.  ------------------------------------------------------------ Right atrium: The atrium was normal in size. No evidence of thrombus in the atrial cavity or appendage.  ------------------------------------------------------------ Pericardium: The pericardium was normal in appearance. There was no pericardial effusion.  ------------------------------------------------------------  Doppler measurements Normal LVOT Peak vel, S 185 cm/s ------ Peak 14 mm ------ gradient, S Hg Aortic valve Peak vel, S 390 cm/s ------ Mean vel, S 310 cm/s ------ VTI, S 112 cm ------ Mean 42 mm ------ gradient, S Hg Peak 61 mm ------ gradient, S Hg Peak vel 0.47 ------ ratio, LVOT/AV Mitral valve Regurg alias 29.7 cm/s ------ vel, PISA Max regurg 556 cm/s ------ vel Regurg VTI 147 cm ------ ERO, PISA 0.27 cm^2 ------ Regurg vol, 40 ml ------ PISA  ------------------------------------------------------------ Prepared and Electronically Authenticated by  Charlton Haws 2015-03-18T08:21:[id number]   Cardiac Catheterization Procedure Note  04/29/2012 Name: Henrine Hayter  MRN: [id number]  DOB: 12-08-1927  Procedure: Right Heart Cath, Placement of catheters for angio without left heart cath, Selective Coronary Angiography, aortic root aortography.  Indication: Left shoulder pain after fall pos troponins  Procedural Details: The right groin was prepped, draped, and anesthetized with 1% lidocaine. Using the modified Seldinger technique a 5 French sheath was placed in the right femoral artery and a 7 French sheath was placed in the right femoral vein. A Swan-Ganz catheter was used for the right heart catheterization. Standard protocol was followed for recording of right heart pressures and sampling of oxygen saturations. Fick cardiac output was calculated. Standard Judkins catheters were  used for selective coronary angiography and left ventriculography. There were no immediate procedural complications. The patient was transferred to the post catheterization recovery area for further monitoring.  Procedural Findings:  Hemodynamics  RA 4  RV 37/4  PA 36/11  PCWP 12  LV not done  AO 150/57 (93)  Oxygen saturations:  SVC 67%  PA 64%  AO 96%  Cardiac Output (Fick) 4.91  Cardiac Index (Fick) 3.21  Cardiac Output (Thermo) 3.25  Cardiac Index (Thermo) 2.12  Coronary angiography:  Coronary dominance: right  Left mainstem: Mild calcification and normal  Left anterior descending (LAD): Mild irregularity and calcification proximally with about 40% narrowing. 40% narrowing at takeoff prior to second diagonal. 75% smooth narrowing in LAD after second diagonal.  Left circumflex (LCx): Provides a small ramus that is free of significant disease. The OM is large and has 30% smooth narrowing. Smaller AV circ without critical disease  Right coronary artery (RCA): Large caliber RCA providing a large PDA and moderate PLA.  Aortic root: Heavily calcified aortic valve with fairly limited mobility. Also moderate AI noted. Aortic root size is normal.  Left ventriculography: Not done  Final Conclusions:  1. Severe AS by echo 0.6 cm2  2. Moderately severe mid LAD stenosis not ideal for PCI.  Recommendations:  1. I will review with Delane Ginger MD and decide on a plan. The valve is fairly severe and she likely needs replacement soon. ? Symptoms unclear.  Shawnie Pons  04/29/2012, 2:05 PM    RISK SCORES Procedure: AV Replacement + CAB Risk of Mortality: 5.604% Morbidity or Mortality: 24.33% Long Length of Stay: 9.623% Short Length of Stay: 18.058% Permanent Stroke: 2.573% Prolonged Ventilation: 15.805% DSW Infection: 0.178% Renal Failure: 5.862% Reoperation: 9.105%    Impression:  She has severe aortic stenosis, moderate MR, and single vessel CAD with a 75% mid LAD stenosis seen  on cath last year. She  denies any symptoms but is sedentary and I suspect her fatigue and occasional dizziness are probably related to the AS. Her operative risk for traditional surgical AVR and CABG is significant with a risk of mortality of 5.6%. This would be higher if she had a mitral valve repair too which may need to be done if she has open surgery. She is currently asymptomatic or minimally symptomatic so it may be best to wait a little while and follow her closely for development of progressive symptoms. It is hard to justify the surgical risk in an 80 year old who says she is asymptomatic and feels fine. TAVR could be considered when she requires surgery if the LAD stenosis has not progressed or if it can be corrected with PCI. I think she would be a candidate for traditional surgical AVR but at a higher risk. I discussed all of this with the patient and her husband and son who lives with them. He will observe her closely for development of symptoms. She should be seen every few months and will need a repeat echo in 6 months.  Plan:  She is going to follow up with Dr. Wyline MoodBranch and will contact me if there is any development of clear-cut symptoms of AS or a change in her clinical state.

## 2013-06-27 ENCOUNTER — Other Ambulatory Visit: Payer: Self-pay | Admitting: Cardiology

## 2013-07-03 ENCOUNTER — Telehealth: Payer: Self-pay | Admitting: *Deleted

## 2013-07-03 NOTE — Telephone Encounter (Signed)
Message copied by Eustace MooreANDERSON, Joee Iovine M on Thu Jul 03, 2013  2:45 PM ------      Message from: CrockettBRANCH, JONATHAN F      Created: Thu Jun 26, 2013  3:28 PM       Can you make sure this pt has an appot in approx 3 months with me                  Dominga FerryJ Branch MD ------

## 2013-07-03 NOTE — Telephone Encounter (Signed)
Patient informed. 

## 2013-09-10 ENCOUNTER — Other Ambulatory Visit: Payer: Self-pay | Admitting: *Deleted

## 2013-09-10 MED ORDER — NITROGLYCERIN 0.4 MG SL SUBL: 0.4000 mg | SUBLINGUAL_TABLET | SUBLINGUAL | Status: AC | PRN

## 2013-10-06 ENCOUNTER — Encounter: Payer: Self-pay | Admitting: Cardiology

## 2013-10-06 ENCOUNTER — Ambulatory Visit (INDEPENDENT_AMBULATORY_CARE_PROVIDER_SITE_OTHER): Payer: Medicare Other | Admitting: Cardiology

## 2013-10-06 VITALS — BP 152/78 | HR 65 | Ht 61.0 in | Wt 124.0 lb

## 2013-10-06 DIAGNOSIS — I350 Nonrheumatic aortic (valve) stenosis: Secondary | ICD-10-CM

## 2013-10-06 DIAGNOSIS — I340 Nonrheumatic mitral (valve) insufficiency: Secondary | ICD-10-CM

## 2013-10-06 DIAGNOSIS — I2510 Atherosclerotic heart disease of native coronary artery without angina pectoris: Secondary | ICD-10-CM

## 2013-10-06 DIAGNOSIS — I359 Nonrheumatic aortic valve disorder, unspecified: Secondary | ICD-10-CM

## 2013-10-06 DIAGNOSIS — I059 Rheumatic mitral valve disease, unspecified: Secondary | ICD-10-CM

## 2013-10-06 DIAGNOSIS — E785 Hyperlipidemia, unspecified: Secondary | ICD-10-CM

## 2013-10-06 NOTE — Patient Instructions (Signed)
Continue all current medications. Follow up in  3 months 

## 2013-10-06 NOTE — Progress Notes (Signed)
Clinical Summary Ms. Zuba is a 80 y.o.female seen today for follow up of the following medical problems.   1. CAD  - 04/2012 cath showed LAD 75% described as not ideal for PCI  - LVEF 55% by echo 05/2012  - denies any chest pain, no SOB or DOE  - compliant with meds   2. HTN  - does not check at home regularly  - compliant with medications   3. Aortic stenosis  - TTE 03/2013 mean grad 45, AVA 0.55 by VTI  - followed by CT surgery Dr Laneta Simmers, given high risk, at this time currently monitoring patient until she is symptomatic.   - denies any new symptoms since last visit. Fairly sedentary lifestyle, highest level of exertion is walking around the house and doing housework which she tolerates well  - no chest pain, no syncope, presyncope, no LE edema  4. Mitral regurgitation  - described as severe by TTE  - TEE 04/2013 moderate MR, ERO 0.27.  - denies any symptoms as described above  - by TTE LVIDs 3.3cm, PASP no reported, RA-RV pressure 47 mmHg, with normal IVC should be approx 50 mmHg.   5. HL  - compliant with statin  - last panel 04/2012: TC 144 TG 65 HDL 62 LDL 69   Past Medical History  Diagnosis Date  . Hypertension     a. on lisinopril x 4 yrs.  . Osteoarthritis      No Known Allergies   Current Outpatient Prescriptions  Medication Sig Dispense Refill  . aspirin EC 81 MG tablet Take 1 tablet (81 mg total) by mouth daily.      Marland Kitchen atorvastatin (LIPITOR) 40 MG tablet TAKE 1 TABLET IN THE EVENING.  30 tablet  6  . lisinopril (PRINIVIL,ZESTRIL) 20 MG tablet Take 20 mg by mouth daily.      . meclizine (ANTIVERT) 12.5 MG tablet Take 1 tablet by mouth every 8 (eight) hours as needed.      . metoprolol (LOPRESSOR) 50 MG tablet TAKE (1) TABLET TWICE DAILY.  60 tablet  6  . Multiple Vitamin (MULTIVITAMIN WITH MINERALS) TABS Take 1 tablet by mouth every morning.       . nitroGLYCERIN (NITROSTAT) 0.4 MG SL tablet Place 1 tablet (0.4 mg total) under the tongue every 5  (five) minutes x 3 doses as needed for chest pain.  25 tablet  3  . traMADol (ULTRAM) 50 MG tablet Take 50 mg by mouth every 8 (eight) hours as needed for pain. For pain       No current facility-administered medications for this visit.     Past Surgical History  Procedure Laterality Date  . Tee without cardioversion N/A 04/29/2013    Procedure: TRANSESOPHAGEAL ECHOCARDIOGRAM (TEE);  Surgeon: Wendall Stade, MD;  Location: [facility] ENDOSCOPY;  Service: Cardiovascular;  Laterality: N/A;     No Known Allergies    Family History  Problem Relation Age of Onset  . Other      non-contributory for premature cad     Social History Ms. Glanz reports that she has never smoked. She has never used smokeless tobacco. Ms. Eisenstein reports that she does not drink alcohol.   Review of Systems CONSTITUTIONAL: No weight loss, fever, chills, weakness or fatigue.  HEENT: Eyes: No visual loss, blurred vision, double vision or yellow sclerae.No hearing loss, sneezing, congestion, runny nose or sore throat.  SKIN: No rash or itching.  CARDIOVASCULAR: per HPI RESPIRATORY: No shortness of  breath, cough or sputum.  GASTROINTESTINAL: No anorexia, nausea, vomiting or diarrhea. No abdominal pain or blood.  GENITOURINARY: No burning on urination, no polyuria NEUROLOGICAL: No headache, dizziness, syncope, paralysis, ataxia, numbness or tingling in the extremities. No change in bowel or bladder control.  MUSCULOSKELETAL: No muscle, back pain, joint pain or stiffness.  LYMPHATICS: No enlarged nodes. No history of splenectomy.  PSYCHIATRIC: No history of depression or anxiety.  ENDOCRINOLOGIC: No reports of sweating, cold or heat intolerance. No polyuria or polydipsia.  Marland Kitchen   Physical Examination p 65 bp 152/78 Wt 124 lbs BMI 23 Gen: resting comfortably, no acute distress HEENT: no scleral icterus, pupils equal round and reactive, no palptable cervical adenopathy,  CV: RRR, 3/6 systolic murmur RUSB and at  apex, no JVD, bilateral carotid bruits Resp: Clear to auscultation bilaterally GI: abdomen is soft, non-tender, non-distended, normal bowel sounds, no hepatosplenomegaly MSK: extremities are warm, no edema.  Skin: warm, no rash Neuro:  no focal deficits Psych: appropriate affect   Diagnostic Studies Cath 04/29/12  Procedural Findings:  Hemodynamics  RA 4  RV 37/4  PA 36/11  PCWP 12  LV not done  AO 150/57 (93)  Oxygen saturations:  SVC 67%  PA 64%  AO 96%  Cardiac Output (Fick) 4.91  Cardiac Index (Fick) 3.21  Cardiac Output (Thermo) 3.25  Cardiac Index (Thermo) 2.12  Coronary angiography:  Coronary dominance: right  Left mainstem: Mild calcification and normal  Left anterior descending (LAD): Mild irregularity and calcification proximally with about 40% narrowing. 40% narrowing at takeoff prior to second diagonal. 75% smooth narrowing in LAD after second diagonal.  Left circumflex (LCx): Provides a small ramus that is free of significant disease. The OM is large and has 30% smooth narrowing. Smaller AV circ without critical disease  Right coronary artery (RCA): Large caliber RCA providing a large PDA and moderate PLA.  Aortic root: Heavily calcified aortic valve with fairly limited mobility. Also moderate AI noted. Aortic root size is normal.  Left ventriculography: Not done  Final Conclusions:  1. Severe AS by echo 0.6 cm2  2. Moderately severe mid LAD stenosis not ideal for PCI.   Echo 05/23/12  LVEF 55%, moderate to severe AS (valve area 0.65 reported mean gradient 33 mmHg, dimensionless index 0.26), mild MR, mild LAE, PASP 40,   05/2012 Carotid US  0-39% bilateral, patent antegrade bilateral vertebrals   03/2013 Echo  LVEF 50-55%, mild LVH, no WMAs, grade II diastolic dysfuction, severe AS (mean grad 45 mmHg, AVA 0.55 by VTI, severe MR, PASP 47, LVIDs 33 mmHg.        Assessment and Plan  1. Aortic stenosis  - severe by most recent echo (critical by reported  area), she denies any significant symptoms however lives a fairly sedentary lifestyle. LVEF 50-55%  - despite her age, she is independent with fairly limited comorbidities  - she has coexsting moderate MR and an LAD lesion from a 2014 cath as well. The MR may be related to increased LV pressures from AS  - she has seen CT surgery, we are continuing to monitor closely for onset of symptoms or structural cardiac changes. F/u 3 months, likely repeat echo around that time as it will have been nearly a year since last study  2. CAD  - single vessel CAD by recent cath, not ideal for PCI per notes  - denies any significant chest pain  - if goes for AVR, would need to be evaluated for possible bypass as  well.   3. HTN  - at goal given her age, continue current meds.   4. HL  - at goal, continue current meds   5. Mitral regurgitation  - described as severe on recent TTE. Normal chamber diamters, LVEF 50-55%. Moderate pulm HTN. TEE shows moderate MR, potentially related to severe AS  - continue to follow clinically    F/u 3 months   Antoine Poche, M.D., F.A.C.C.

## 2013-11-26 ENCOUNTER — Other Ambulatory Visit: Payer: Self-pay | Admitting: Cardiology

## 2014-01-12 ENCOUNTER — Ambulatory Visit (INDEPENDENT_AMBULATORY_CARE_PROVIDER_SITE_OTHER): Payer: Medicare Other | Admitting: Cardiology

## 2014-01-12 ENCOUNTER — Encounter: Payer: Self-pay | Admitting: Cardiology

## 2014-01-12 VITALS — BP 137/72 | HR 67 | Ht 62.0 in | Wt 125.8 lb

## 2014-01-12 DIAGNOSIS — I340 Nonrheumatic mitral (valve) insufficiency: Secondary | ICD-10-CM

## 2014-01-12 DIAGNOSIS — I2510 Atherosclerotic heart disease of native coronary artery without angina pectoris: Secondary | ICD-10-CM

## 2014-01-12 DIAGNOSIS — I350 Nonrheumatic aortic (valve) stenosis: Secondary | ICD-10-CM

## 2014-01-12 NOTE — Patient Instructions (Signed)
   Your physician has requested that you have an echocardiogram. Echocardiography is a painless test that uses sound waves to create images of your heart. It provides your doctor with information about the size and shape of your heart and how well your heart's chambers and valves are working. This procedure takes approximately one hour. There are no restrictions for this procedure. Office will contact with results via phone or letter.   Continue all current medications. Your physician wants you to follow up in: 6 months.  You will receive a reminder letter in the mail one-two months in advance.  If you don't receive a letter, please call our office to schedule the follow up appointment     

## 2014-01-12 NOTE — Progress Notes (Signed)
Clinical Summary [name] is a 80 y.o.female seen today for follow up of the following medical problems.   1. CAD  - 04/2012 cath showed LAD 75% described as not ideal for PCI  - LVEF 55% by echo 05/2012  - denies any chest pain, no SOB or DOE  - compliant with meds   2. Aortic stenosis  - TTE 03/2013 mean grad 45, AVA 0.55 by VTI  - followed by CT surgery Dr Rita Walters, given high risk, at this time currently monitoring patient until she is symptomatic.   - since last visit no chest pain, no syncope, presyncope, no LE edema. No dizziness, no presyncope or syncope  3. Mitral regurgitation  - described as severe by TTE  - TEE 04/2013 moderate MR, ERO 0.27.  - denies any symptoms as described above  - by TTE LVIDs 3.3cm, PASP no reported, RA-RV pressure 47 mmHg, with normal IVC should be approx 50 mmHg.   - denies any recent symptoms    Past Medical History  Diagnosis Date  . Hypertension     a. on lisinopril x 4 yrs.  . Osteoarthritis      No Known Allergies   Current Outpatient Prescriptions  Medication Sig Dispense Refill  . aspirin EC 81 MG tablet Take 1 tablet (81 mg total) by mouth daily.    Marland Kitchen. atorvastatin (LIPITOR) 40 MG tablet TAKE 1 TABLET IN THE EVENING. 30 tablet 4  . lisinopril (PRINIVIL,ZESTRIL) 20 MG tablet Take 20 mg by mouth daily.    . meclizine (ANTIVERT) 12.5 MG tablet Take 1 tablet by mouth every 8 (eight) hours as needed.    . metoprolol (LOPRESSOR) 50 MG tablet TAKE (1) TABLET TWICE DAILY. 60 tablet 6  . Multiple Vitamin (MULTIVITAMIN WITH MINERALS) TABS Take 1 tablet by mouth every morning.     . nitroGLYCERIN (NITROSTAT) 0.4 MG SL tablet Place 1 tablet (0.4 mg total) under the tongue every 5 (five) minutes x 3 doses as needed for chest pain. 25 tablet 3  . traMADol (ULTRAM) 50 MG tablet Take 50 mg by mouth every 8 (eight) hours as needed for pain. For pain     No current facility-administered medications for this visit.     Past  Surgical History  Procedure Laterality Date  . Tee without cardioversion N/A 04/29/2013    Procedure: TRANSESOPHAGEAL ECHOCARDIOGRAM (TEE);  Surgeon: Wendall StadePeter C Nishan, MD;  [facility];  Service: Cardiovascular;  Laterality: N/A;     No Known Allergies    Family History  Problem Relation Age of Onset  . Other      non-contributory for premature cad     Social History [name] reports that she has never smoked. She has never used smokeless tobacco. [name] reports that she does not drink alcohol.   Review of Systems CONSTITUTIONAL: No weight loss, fever, chills, weakness or fatigue.  HEENT: Eyes: No visual loss, blurred vision, double vision or yellow sclerae.No hearing loss, sneezing, congestion, runny nose or sore throat.  SKIN: No rash or itching.  CARDIOVASCULAR: per HPI RESPIRATORY: No shortness of breath, cough or sputum.  GASTROINTESTINAL: No anorexia, nausea, vomiting or diarrhea. No abdominal pain or blood.  GENITOURINARY: No burning on urination, no polyuria NEUROLOGICAL: No headache, dizziness, syncope, paralysis, ataxia, numbness or tingling in the extremities. No change in bowel or bladder control.  MUSCULOSKELETAL: No muscle, back pain, joint pain or stiffness.  LYMPHATICS: No enlarged nodes. No history of splenectomy.  PSYCHIATRIC:  No history of depression or anxiety.  ENDOCRINOLOGIC: No reports of sweating, cold or heat intolerance. No polyuria or polydipsia.  Marland Kitchen   Physical Examination p 67 bp 137/72 Wt 125 lbs BMI 23 Gen: resting comfortably, no acute distress HEENT: no scleral icterus, pupils equal round and reactive, no palptable cervical adenopathy,  CV: RRR, 3/6 systolic murmur RUSB, 3/6 systolic murmur at apex Resp: Clear to auscultation bilaterally GI: abdomen is soft, non-tender, non-distended, normal bowel sounds, no hepatosplenomegaly MSK: extremities are warm, no edema.  Skin: warm, no rash Neuro:  no focal deficits Psych:  appropriate affect   Diagnostic Studies Cath 04/29/12  Procedural Findings:  Hemodynamics  RA 4  RV 37/4  PA 36/11  PCWP 12  LV not done  AO 150/57 (93)  Oxygen saturations:  SVC 67%  PA 64%  AO 96%  Cardiac Output (Fick) 4.91  Cardiac Index (Fick) 3.21  Cardiac Output (Thermo) 3.25  Cardiac Index (Thermo) 2.12  Coronary angiography:  Coronary dominance: right  Left mainstem: Mild calcification and normal  Left anterior descending (LAD): Mild irregularity and calcification proximally with about 40% narrowing. 40% narrowing at takeoff prior to second diagonal. 75% smooth narrowing in LAD after second diagonal.  Left circumflex (LCx): Provides a small ramus that is free of significant disease. The OM is large and has 30% smooth narrowing. Smaller AV circ without critical disease  Right coronary artery (RCA): Large caliber RCA providing a large PDA and moderate PLA.  Aortic root: Heavily calcified aortic valve with fairly limited mobility. Also moderate AI noted. Aortic root size is normal.  Left ventriculography: Not done  Final Conclusions:  1. Severe AS by echo 0.6 cm2  2. Moderately severe mid LAD stenosis not ideal for PCI.   Echo 05/23/12  LVEF 55%, moderate to severe AS (valve area 0.65 reported mean gradient 33 mmHg, dimensionless index 0.26), mild MR, mild LAE, PASP 40,   05/2012 Carotid US  0-39% bilateral, patent antegrade bilateral vertebrals   03/2013 Echo  LVEF 50-55%, mild LVH, no WMAs, grade II diastolic dysfuction, severe AS (mean grad 45 mmHg, AVA 0.55 by VTI, severe MR, PASP 47, LVIDs 33 mmHg.   04/2013 TEE Study Conclusions  - Left ventricle: Hypertrophy was noted. Systolic function was normal. The estimated ejection fraction was in the range of 55% to 60%. - Aortic valve: Heavily calcified AV. Unable to accurately trace valve area. Severe aortic stenosis. Milld aortic regurgitation - Mitral valve: MAC- with  overall moderate mitral regurgitation. ERO of .27cm2 and Regurgitant volume of 40cc both in moderate range. Mechanism likely restricted motion of posterior leaflet No prolapse - Left atrium: The atrium was dilated. - Right atrium: No evidence of thrombus in the atrial cavity or appendage. - Atrial septum: No defect or patent foramen ovale was identified. Echo contrast study showed no right-to-left atrial level shunt, following an increase in RA pressure induced by provocative maneuvers. - Tricuspid valve: Moderate regurgitation. - Impressions: Given patient age and fraily may look into TAVR procedure for severe AS and MR likely to improve a grade after AS relieved Impressions:  - Given patient age and fraily may look into TAVR procedure for severe AS and MR likely to improve a grade after AS relieved       Assessment and Plan  1. Aortic stenosis  - severe by most recent echo (critical by reported area), she denies any significant symptoms however lives a fairly sedentary lifestyle. LVEF 50-55%  - despite her age, she is  independent with fairly limited comorbidities  - she has coexsting moderate MR and an LAD lesion from a 2014 cath as well. The MR may be related to increased LV pressures from AS  - she has seen CT surgery, we are continuing to monitor closely for onset of symptoms or structural cardiac changes. - we will repeat echo to evaluate for subclinical structural changes of the heart  2. CAD  - single vessel CAD by recent cath, not ideal for PCI per notes  - denies any significant chest pain  - if goes for AVR, would need to be evaluated for possible bypass as well.   3. Mitral regurgitation  - described as severe on recent TTE. Normal chamber diamters, LVEF 50-55%. Moderate pulm HTN. TEE shows moderate MR, potentially related to severe AS  - continue to follow clinically, f/u repeat echo    F/u 6 months     Antoine PocheJonathan F. Aylla Huffine,  M.D.

## 2014-01-15 ENCOUNTER — Other Ambulatory Visit: Payer: Medicare Other

## 2014-01-22 ENCOUNTER — Encounter (HOSPITAL_COMMUNITY): Payer: Self-pay | Admitting: Cardiology

## 2014-01-28 ENCOUNTER — Other Ambulatory Visit (INDEPENDENT_AMBULATORY_CARE_PROVIDER_SITE_OTHER): Payer: Medicare Other

## 2014-01-28 ENCOUNTER — Other Ambulatory Visit: Payer: Self-pay

## 2014-01-28 DIAGNOSIS — I2510 Atherosclerotic heart disease of native coronary artery without angina pectoris: Secondary | ICD-10-CM

## 2014-01-28 DIAGNOSIS — I340 Nonrheumatic mitral (valve) insufficiency: Secondary | ICD-10-CM

## 2014-01-28 DIAGNOSIS — I350 Nonrheumatic aortic (valve) stenosis: Secondary | ICD-10-CM

## 2014-01-30 ENCOUNTER — Other Ambulatory Visit: Payer: Self-pay | Admitting: Cardiology

## 2014-02-02 ENCOUNTER — Telehealth: Payer: Self-pay | Admitting: *Deleted

## 2014-02-02 NOTE — Telephone Encounter (Signed)
Notes Recorded by Lesle ChrisAngela G Kylani Wires, LPN on 16/10/960412/21/2015 at 5:55 PM Son (Jorja Loaim) notified.

## 2014-02-02 NOTE — Telephone Encounter (Signed)
-----   Message from Antoine PocheJonathan F Branch, MD sent at 01/29/2014 12:40 PM EST ----- Her aortic valve and mitral valve appears overall stable. There has been no changes in the size of strength of the heart. We will continue to monitor  Dominga FerryJ Branch MD

## 2014-02-25 ENCOUNTER — Other Ambulatory Visit: Payer: Medicare Other

## 2014-02-26 ENCOUNTER — Other Ambulatory Visit: Payer: Medicare Other

## 2014-03-30 ENCOUNTER — Telehealth: Payer: Self-pay | Admitting: Cardiology

## 2014-03-30 NOTE — Telephone Encounter (Signed)
Son called to let us know that his mom is in a nursing home and probably will not be coming back due to her health

## 2014-04-14 DEATH — deceased

## 2014-12-05 IMAGING — CR DG SHOULDER 2+V*L*
3 series · 3 of 3 positions shown · non-contrast
Comparison: None.

CLINICAL DATA: Fall.  Left shoulder pain.

LEFT SHOULDER - 2+ VIEW

[w shoulder ap internal left]
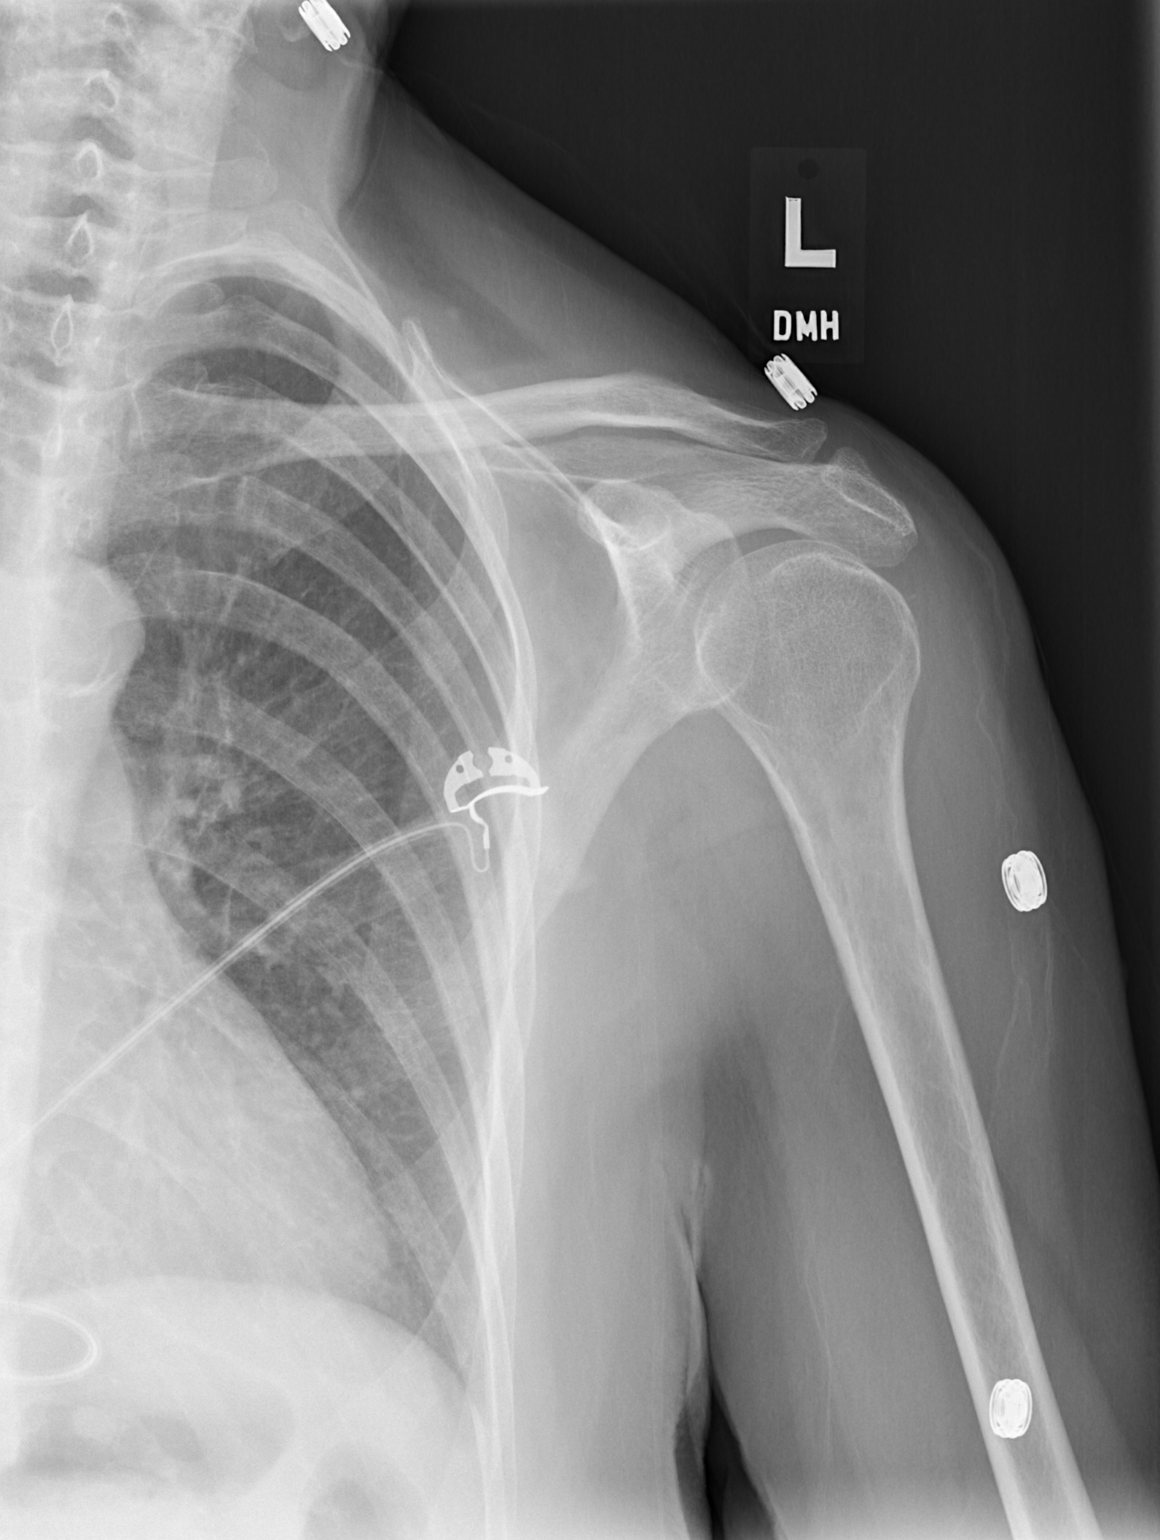

[w shoulder ap external left]
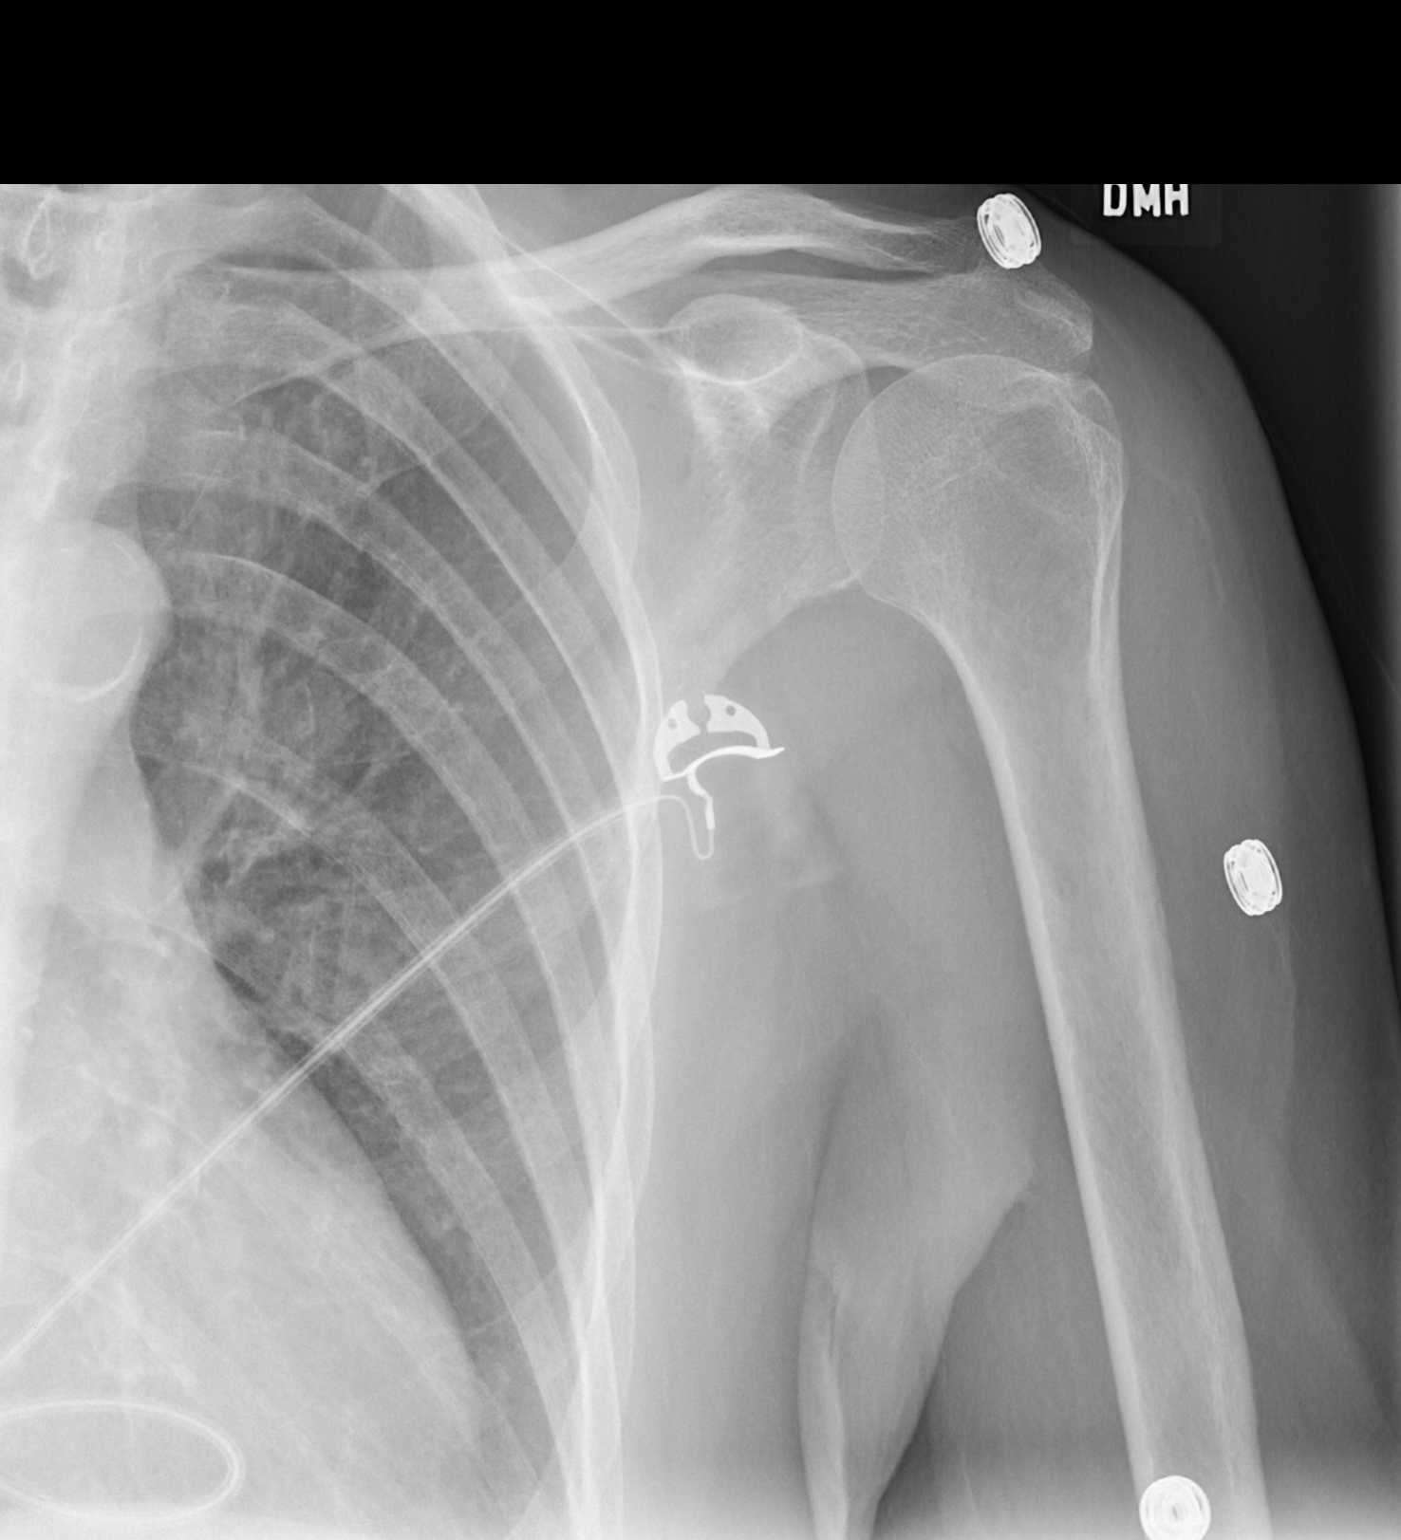

[w shoulder y view left]
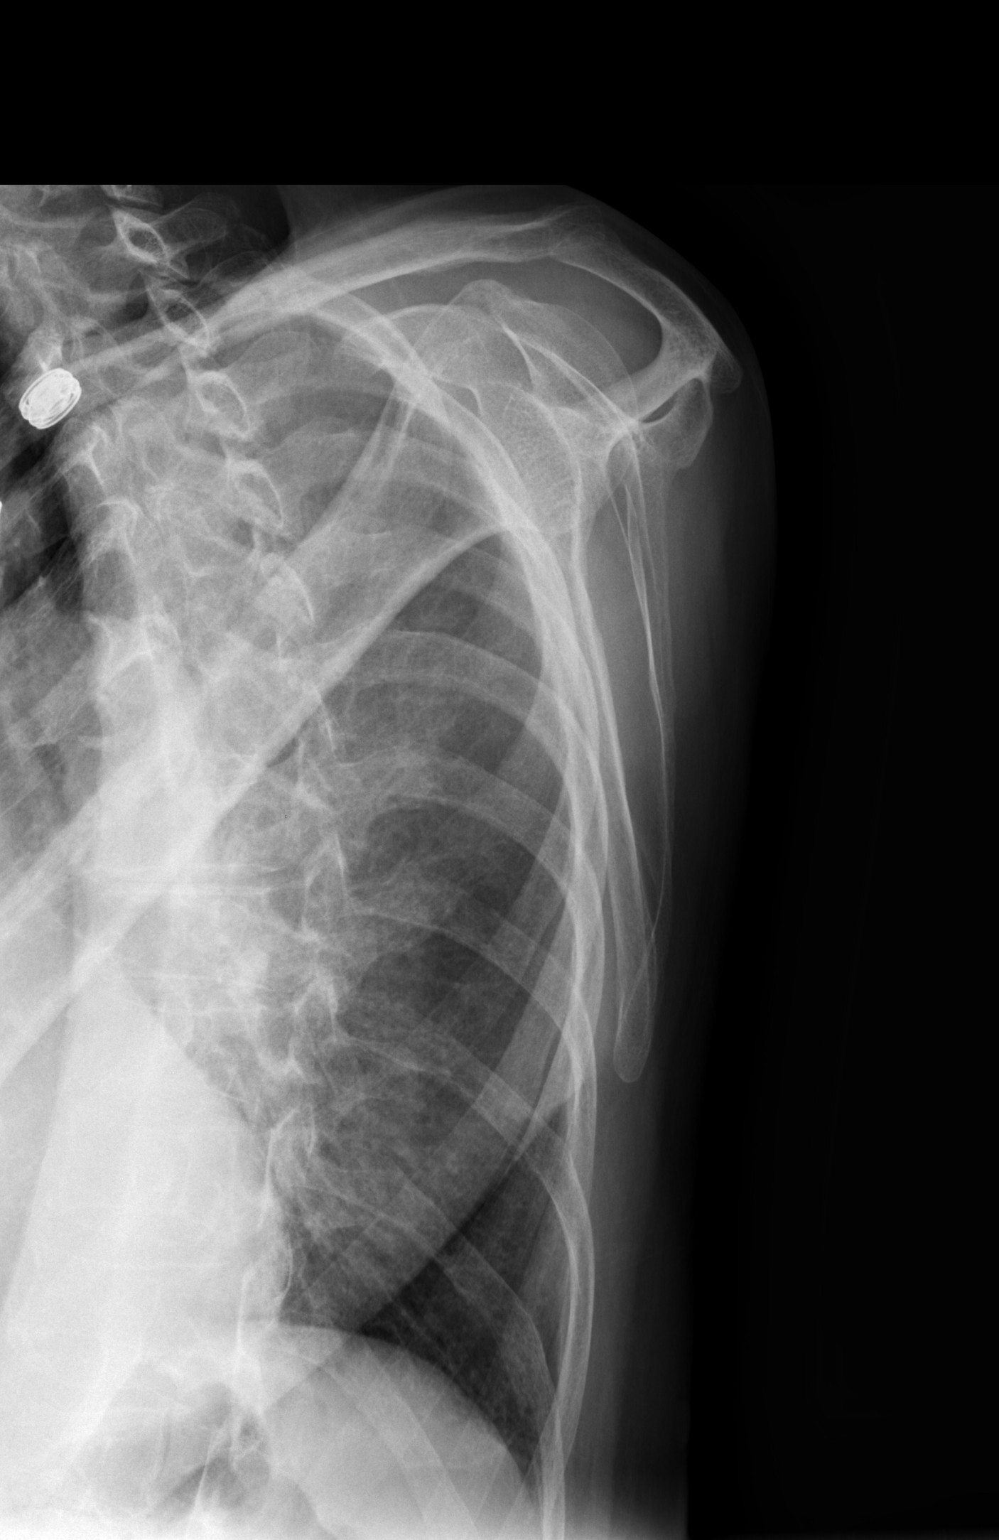

[3 of 3 positions shown; findings below may reference images not displayed]

FINDINGS: Internal and external rotation views appear normal.
There is no fracture.  Scapula appears normal.  Visualized left
chest unremarkable.
IMPRESSION: Negative.
